# Patient Record
Sex: Female | Born: 1950 | Race: White | Hispanic: No
Health system: Midwestern US, Community
[De-identification: ages and names within clinical notes are randomized; demographics above are authoritative.]

## PROBLEM LIST (undated history)

## (undated) DIAGNOSIS — E039 Hypothyroidism, unspecified: Secondary | ICD-10-CM

## (undated) DIAGNOSIS — Z1211 Encounter for screening for malignant neoplasm of colon: Secondary | ICD-10-CM

## (undated) DIAGNOSIS — Z1231 Encounter for screening mammogram for malignant neoplasm of breast: Secondary | ICD-10-CM

## (undated) DIAGNOSIS — E559 Vitamin D deficiency, unspecified: Principal | ICD-10-CM

## (undated) DIAGNOSIS — Z780 Asymptomatic menopausal state: Secondary | ICD-10-CM

## (undated) HISTORY — DX: Hypothyroidism, unspecified: E03.9

---

## 2005-01-12 ENCOUNTER — Ambulatory Visit: Payer: Self-pay

## 2006-04-27 ENCOUNTER — Ambulatory Visit: Payer: Self-pay

## 2007-08-25 ENCOUNTER — Ambulatory Visit: Payer: Self-pay

## 2008-08-28 ENCOUNTER — Ambulatory Visit: Payer: Self-pay

## 2009-12-23 ENCOUNTER — Ambulatory Visit: Payer: Self-pay

## 2010-03-14 ENCOUNTER — Ambulatory Visit: Payer: Self-pay | Admitting: Unknown Physician Specialty

## 2011-03-19 ENCOUNTER — Ambulatory Visit: Payer: Self-pay

## 2012-06-07 ENCOUNTER — Ambulatory Visit: Payer: Self-pay

## 2012-12-06 ENCOUNTER — Ambulatory Visit: Payer: Self-pay

## 2013-12-25 ENCOUNTER — Ambulatory Visit: Payer: Self-pay

## 2014-12-18 ENCOUNTER — Other Ambulatory Visit: Payer: Self-pay | Admitting: Unknown Physician Specialty

## 2014-12-18 DIAGNOSIS — Z1231 Encounter for screening mammogram for malignant neoplasm of breast: Secondary | ICD-10-CM

## 2014-12-28 ENCOUNTER — Other Ambulatory Visit: Payer: Self-pay | Admitting: Unknown Physician Specialty

## 2014-12-28 ENCOUNTER — Ambulatory Visit
Admission: RE | Admit: 2014-12-28 | Discharge: 2014-12-28 | Disposition: A | Discharge status: Home / Self Care | Payer: Commercial Managed Care - PPO | Source: Ambulatory Visit | Attending: Unknown Physician Specialty | Admitting: Unknown Physician Specialty

## 2014-12-28 DIAGNOSIS — Z1231 Encounter for screening mammogram for malignant neoplasm of breast: Secondary | ICD-10-CM

## 2015-12-30 ENCOUNTER — Other Ambulatory Visit: Payer: Self-pay | Admitting: Obstetrics and Gynecology

## 2015-12-30 DIAGNOSIS — Z1231 Encounter for screening mammogram for malignant neoplasm of breast: Secondary | ICD-10-CM

## 2016-01-06 ENCOUNTER — Other Ambulatory Visit: Payer: Self-pay | Admitting: Obstetrics and Gynecology

## 2016-01-06 ENCOUNTER — Telehealth: Payer: Self-pay | Admitting: Gastroenterology

## 2016-01-06 DIAGNOSIS — Z13820 Encounter for screening for osteoporosis: Secondary | ICD-10-CM

## 2016-01-06 NOTE — Telephone Encounter (Signed)
colonoscopy

## 2016-01-08 ENCOUNTER — Other Ambulatory Visit: Payer: Self-pay | Admitting: Obstetrics and Gynecology

## 2016-01-08 ENCOUNTER — Ambulatory Visit
Admission: RE | Admit: 2016-01-08 | Discharge: 2016-01-08 | Disposition: A | Discharge status: Home / Self Care | Payer: Commercial Managed Care - PPO | Source: Ambulatory Visit | Attending: Obstetrics and Gynecology | Admitting: Obstetrics and Gynecology

## 2016-01-08 DIAGNOSIS — Z1231 Encounter for screening mammogram for malignant neoplasm of breast: Secondary | ICD-10-CM

## 2016-01-13 ENCOUNTER — Other Ambulatory Visit: Payer: Self-pay

## 2016-01-13 ENCOUNTER — Telehealth: Payer: Self-pay

## 2016-01-13 NOTE — Telephone Encounter (Signed)
LVM for patient to return call to schedule Colonoscopy.

## 2016-01-28 ENCOUNTER — Telehealth: Payer: Self-pay

## 2016-01-28 NOTE — Telephone Encounter (Signed)
Called patient and left a voicemail. Appointment not made

## 2016-01-29 NOTE — Telephone Encounter (Signed)
See other notes regarding previous colonoscopy.

## 2016-01-29 NOTE — Telephone Encounter (Signed)
Patient left voice message that she had a colonoscopy 2 years ago and everything was clear. She doesn't need to make an appointment.

## 2016-02-04 ENCOUNTER — Ambulatory Visit: Payer: Commercial Managed Care - PPO

## 2016-02-20 ENCOUNTER — Telehealth: Payer: Self-pay | Admitting: Family Medicine

## 2016-02-20 NOTE — Telephone Encounter (Signed)
Pt called stated she needs a refill on Synthroid. 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 Pt would like refill sent to [facility] in [place]. Pt informed that she would likely need to re-establish as a pt before medication can be sent to the pharmacy. Pt asked that staff send a message to [place]. Thanks.

## 2016-02-20 NOTE — Telephone Encounter (Signed)
Returned patient's call, no answer, LVM  Stating [facility] could not write any type of Rx for her as she has not been seen in about 4 years.  He did suggest that whomever did her bloodwork could write the Rx.  Offered to re establish care in this office and would be happy to write at that time.

## 2016-02-25 ENCOUNTER — Ambulatory Visit: Payer: Commercial Managed Care - PPO

## 2016-03-26 ENCOUNTER — Ambulatory Visit
Admission: RE | Admit: 2016-03-26 | Discharge: 2016-03-26 | Disposition: A | Discharge status: Home / Self Care | Payer: Commercial Managed Care - PPO | Source: Ambulatory Visit | Attending: Obstetrics and Gynecology | Admitting: Obstetrics and Gynecology

## 2016-03-26 ENCOUNTER — Other Ambulatory Visit: Payer: Self-pay | Admitting: Obstetrics and Gynecology

## 2016-03-26 DIAGNOSIS — Z780 Asymptomatic menopausal state: Secondary | ICD-10-CM | POA: Diagnosis not present

## 2016-03-26 DIAGNOSIS — E039 Hypothyroidism, unspecified: Secondary | ICD-10-CM | POA: Diagnosis not present

## 2016-03-26 DIAGNOSIS — Z13820 Encounter for screening for osteoporosis: Secondary | ICD-10-CM

## 2016-03-26 DIAGNOSIS — M85851 Other specified disorders of bone density and structure, right thigh: Secondary | ICD-10-CM | POA: Insufficient documentation

## 2017-03-17 ENCOUNTER — Telehealth: Payer: Self-pay

## 2017-03-17 ENCOUNTER — Other Ambulatory Visit: Payer: Self-pay | Admitting: Obstetrics and Gynecology

## 2017-03-17 DIAGNOSIS — Z13220 Encounter for screening for lipoid disorders: Secondary | ICD-10-CM

## 2017-03-17 DIAGNOSIS — Z0000 Encounter for general adult medical examination without abnormal findings: Secondary | ICD-10-CM

## 2017-03-17 DIAGNOSIS — Z131 Encounter for screening for diabetes mellitus: Secondary | ICD-10-CM

## 2017-03-17 DIAGNOSIS — Z1329 Encounter for screening for other suspected endocrine disorder: Secondary | ICD-10-CM

## 2017-03-17 NOTE — Telephone Encounter (Signed)
Pt lives in [place], [place] and is coming in Fri am for appt c SDJ.  Would like to have order for 'full blood panel' put in so she can drive in fasting, have blood drawn then eat on Fri morning.  Also would like order for mammogram to be put in at [facility] for [place] am.  [phone]

## 2017-03-18 NOTE — Addendum Note (Signed)
Addended by: Prentice Docker D on: 03/18/2017 10:13 AM   Modules accepted: Orders

## 2017-03-18 NOTE — Telephone Encounter (Signed)
Pt calling.  Hasn't heard from yesterday's phone call.  Adv orders are in and needs appt for blood work.  Adv can call [facility] after visit to sched mammogram.  Tx'd for front desk to sched lab.

## 2017-03-19 ENCOUNTER — Other Ambulatory Visit: Payer: Medicare Other

## 2017-03-19 ENCOUNTER — Ambulatory Visit (INDEPENDENT_AMBULATORY_CARE_PROVIDER_SITE_OTHER): Payer: Medicare Other | Admitting: Obstetrics and Gynecology

## 2017-03-19 ENCOUNTER — Encounter: Payer: Self-pay | Admitting: Obstetrics and Gynecology

## 2017-03-19 VITALS — BP 128/78 | Ht 67.0 in | Wt 194.0 lb

## 2017-03-19 DIAGNOSIS — Z1329 Encounter for screening for other suspected endocrine disorder: Secondary | ICD-10-CM | POA: Diagnosis not present

## 2017-03-19 DIAGNOSIS — Z0000 Encounter for general adult medical examination without abnormal findings: Secondary | ICD-10-CM | POA: Diagnosis not present

## 2017-03-19 DIAGNOSIS — Z1331 Encounter for screening for depression: Secondary | ICD-10-CM

## 2017-03-19 DIAGNOSIS — M85859 Other specified disorders of bone density and structure, unspecified thigh: Secondary | ICD-10-CM

## 2017-03-19 DIAGNOSIS — Z1211 Encounter for screening for malignant neoplasm of colon: Secondary | ICD-10-CM

## 2017-03-19 DIAGNOSIS — Z13220 Encounter for screening for lipoid disorders: Secondary | ICD-10-CM | POA: Diagnosis not present

## 2017-03-19 DIAGNOSIS — Z1339 Encounter for screening examination for other mental health and behavioral disorders: Secondary | ICD-10-CM

## 2017-03-19 DIAGNOSIS — Z01419 Encounter for gynecological examination (general) (routine) without abnormal findings: Secondary | ICD-10-CM | POA: Diagnosis not present

## 2017-03-19 DIAGNOSIS — Z131 Encounter for screening for diabetes mellitus: Secondary | ICD-10-CM | POA: Diagnosis not present

## 2017-03-19 NOTE — Progress Notes (Signed)
Gynecology Annual Exam  PCP: Patient, No Pcp Per  Chief Complaint  Patient presents with  . Annual Exam    History of Present Illness:  [name] is a 66 y.o. [phone] who LMP was No LMP recorded. Patient is postmenopausal., presents today for her annual examination.  Denies postmenopausal bleeding.  She does not have vasomotor sx.  She is not sexually active. She does not have vaginal dryness.  Last Pap: 2016  Results were: no abnormalities /neg HPV DNA.  Hx of STDs: none  Last mammogram: 12/2015  Results were: normal--routine follow-up in 12 months, BiRads 1 There is no FH of breast cancer. There is no FH of ovarian cancer. The patient does not do self-breast exams.  Colonoscopy: due to history of polyps.   DEXA: 1 year ago, osteopenia with FRAX score 0.8% for hip fracture (treatment threshold is 3%), 8.4% for major osteoporotic fracture (20% threshold for treatment).  Tobacco use: The patient denies current or previous tobacco use. Alcohol use: none Exercise: not active  She does get adequate calcium and Vitamin D in her diet.  The patient wears seatbelts: yes.     Past Medical History:  Diagnosis Date  . Hypothyroidism     History reviewed. No pertinent surgical history.  Prior to Admission medications   Medication Sig Start Date End Date Taking? Authorizing Provider  levothyroxine (SYNTHROID, LEVOTHROID) 175 MCG tablet Take by mouth.    [provider]  SYNTHROID 50 MCG tablet TAKE 1 TABLET EVERY DAY ON AN EMPTY STOMACH WITH A GLASS OF WATER AT LEAST 30-60 MINUTES BEFORE BREAKFAST 03/17/17   [name], MD    Allergies  Allergen Reactions  . Sulfa Antibiotics Rash    Gynecologic History:  No LMP recorded. Patient is postmenopausal. Contraception: none  Obstetric History: [id number]  Family History  Problem Relation Age of Onset  . Breast cancer Neg Hx     Social History   Social History  . Marital status: Divorced    Spouse  name: N/A  . Number of children: N/A  . Years of education: N/A   Occupational History  . Not on file.   Social History Main Topics  . Smoking status: Never Smoker  . Smokeless tobacco: Never Used  . Alcohol use No  . Drug use: No  . Sexual activity: Not Currently    Birth control/ protection: Post-menopausal   Other Topics Concern  . Not on file   Social History Narrative  . No narrative on file    Review of Systems  Constitutional: Negative.   HENT: Negative.   Eyes: Negative.   Respiratory: Negative.   Cardiovascular: Negative.   Gastrointestinal: Negative.   Genitourinary: Negative.   Musculoskeletal: Negative.   Skin: Negative.   Neurological: Negative.   Psychiatric/Behavioral: Negative.      Physical Exam BP 128/78   Ht 5\' 7"  (1.702 m)   Wt 194 lb (88 kg)   BMI 30.38 kg/m   Physical Exam  Constitutional: She is oriented to person, place, and time. She appears well-developed and well-nourished. No distress.  Genitourinary: Vagina normal and uterus normal. Pelvic exam was performed with patient supine. There is no rash, tenderness or lesion on the right labia. There is no rash, tenderness or lesion on the left labia. Right adnexum does not display mass, does not display tenderness and does not display fullness. Left adnexum does not display mass, does not display tenderness and does not display fullness. Cervix does not  exhibit motion tenderness, lesion or polyp.   Uterus is mobile. Uterus is not enlarged, tender or exhibiting a mass.  HENT:  Head: Normocephalic and atraumatic.  Eyes: EOM are normal. No scleral icterus.  Neck: Normal range of motion. Neck supple. No thyromegaly present.  Cardiovascular: Normal rate and regular rhythm.  Exam reveals no gallop.   No murmur heard. Pulmonary/Chest: Effort normal and breath sounds normal. No respiratory distress. She has no wheezes. She has no rales. Right breast exhibits no inverted nipple, no mass, no nipple  discharge, no skin change and no tenderness. Left breast exhibits no inverted nipple, no mass, no nipple discharge, no skin change and no tenderness.  Abdominal: Soft. Bowel sounds are normal. She exhibits no distension and no mass. There is no tenderness. There is no rebound and no guarding.  Musculoskeletal: Normal range of motion. She exhibits no edema.  Lymphadenopathy:    She has no cervical adenopathy.  Neurological: She is alert and oriented to person, place, and time. No cranial nerve deficit.  Skin: Skin is warm and dry. No erythema.  Psychiatric: She has a normal mood and affect. Her behavior is normal. Judgment normal.   Female chaperone present for pelvic and breast  portions of the physical exam  Results: AUDIT Questionnaire (screen for alcoholism): 1 PHQ-9: 2  Assessment: 66 y.o. [id number] female here for routine gynecologic examination.  Plan: Problem List Items Addressed This Visit    Osteopenia of hip    Other Visit Diagnoses    Women's annual routine gynecological examination    -  Primary   Screening for depression       Screening for alcohol problem       Screen for colon cancer       Relevant Orders   Ambulatory referral to Gastroenterology      Screening: -- Blood pressure screen normal -- Colonoscopy - due - will schedule -- Mammogram - due. Patient to call [facility] to arrange. She understands that it is her responsibility to arrange this. -- Weight screening: obese: discussed management options, including lifestyle, dietary, and exercise. -- Depression screening negative (PHQ-9) -- Nutrition: normal -- cholesterol screening: will obtain -- osteoporosis screening: due next year. Continue taking Calcium/Vitamin D -- tobacco screening: not using -- alcohol screening: AUDIT questionnaire indicates low-risk usage. -- family history of breast cancer screening: done. not at high risk. -- no evidence of domestic violence or intimate partner violence. -- STD  screening: gonorrhea/chlamydia NAAT not collected per patient request. -- pap smear not collected per ASCCP guidelines -- flu vaccine patient to get this year -- HPV vaccination series: not eligilbe  -- vaccines: up to date. -- screening labs obtained today.  Entered in separate 'Orders Only' encounter.    Prentice Docker, MD 03/23/2017 1:47 PM

## 2017-03-20 LAB — LIPID PANEL WITH LDL/HDL RATIO
CHOLESTEROL TOTAL: 172 mg/dL (ref 100–199)
HDL: 46 mg/dL (ref >39)
LDL Calculated: 95 mg/dL (ref 0–99)
LDL/HDL RATIO: 2.1 ratio (ref 0.0–3.2)
TRIGLYCERIDES: 155 mg/dL — AB (ref 0–149)
VLDL Cholesterol Cal: 31 mg/dL (ref 5–40)

## 2017-03-20 LAB — COMPREHENSIVE METABOLIC PANEL
ALK PHOS: 67 IU/L (ref 39–117)
ALT: 20 IU/L (ref 0–32)
AST: 20 IU/L (ref 0–40)
Albumin/Globulin Ratio: 1.6 (ref 1.2–2.2)
Albumin: 4.5 g/dL (ref 3.6–4.8)
BILIRUBIN TOTAL: 0.8 mg/dL (ref 0.0–1.2)
BUN/Creatinine Ratio: 15 (ref 12–28)
BUN: 11 mg/dL (ref 8–27)
CHLORIDE: 104 mmol/L (ref 96–106)
CO2: 22 mmol/L (ref 20–29)
Calcium: 9.6 mg/dL (ref 8.7–10.3)
Creatinine, Ser: 0.73 mg/dL (ref 0.57–1.00)
GFR calc Af Amer: 99 mL/min/{1.73_m2} (ref >59)
GFR calc non Af Amer: 85 mL/min/{1.73_m2} (ref >59)
GLUCOSE: 96 mg/dL (ref 65–99)
Globulin, Total: 2.8 g/dL (ref 1.5–4.5)
Potassium: 4.5 mmol/L (ref 3.5–5.2)
Sodium: 140 mmol/L (ref 134–144)
Total Protein: 7.3 g/dL (ref 6.0–8.5)

## 2017-03-20 LAB — TSH+FREE T4
FREE T4: 1.24 ng/dL (ref 0.82–1.77)
TSH: 3.17 u[IU]/mL (ref 0.450–4.500)

## 2017-03-20 LAB — CBC
HEMATOCRIT: 45.9 % (ref 34.0–46.6)
HEMOGLOBIN: 14.8 g/dL (ref 11.1–15.9)
MCH: 29.7 pg (ref 26.6–33.0)
MCHC: 32.2 g/dL (ref 31.5–35.7)
MCV: 92 fL (ref 79–97)
Platelets: 324 10*3/uL (ref 150–379)
RBC: 4.99 x10E6/uL (ref 3.77–5.28)
RDW: 14 % (ref 12.3–15.4)
WBC: 4.9 10*3/uL (ref 3.4–10.8)

## 2017-03-20 LAB — HGB A1C W/O EAG: Hgb A1c MFr Bld: 5.7 % — ABNORMAL HIGH (ref 4.8–5.6)

## 2017-03-20 LAB — VITAMIN D 25 HYDROXY (VIT D DEFICIENCY, FRACTURES): Vit D, 25-Hydroxy: 31.3 ng/mL (ref 30.0–100.0)

## 2017-03-23 ENCOUNTER — Encounter: Payer: Self-pay | Admitting: Obstetrics and Gynecology

## 2017-03-23 DIAGNOSIS — M85859 Other specified disorders of bone density and structure, unspecified thigh: Secondary | ICD-10-CM | POA: Insufficient documentation

## 2017-03-29 ENCOUNTER — Telehealth: Payer: Self-pay

## 2017-03-29 NOTE — Telephone Encounter (Signed)
It does sound like she would need to be seen where she is.  I can't call in medication for a UTI in this circumstance.

## 2017-03-29 NOTE — Telephone Encounter (Signed)
Trying to call pt back, no answer. Please advise her of this when she calls back. Thank you

## 2017-03-29 NOTE — Telephone Encounter (Signed)
Pt calling triage this AM, states she is having UTI symptoms starting Thursday. Urine is cloudy, odor , urinary frequency, dysuria. Pt is  In [place] visiting daughter, pt needs RX sent to [facility]  [place] [address] , phone number [phone]. pt aware I would ask SDJ about this since we cannot test her urine. Please ask SDJ and let me know I will call pt back.

## 2017-03-29 NOTE — Telephone Encounter (Signed)
I would think she would need to be seen at an urgent care there. Message forwarded to [facility] as instructed

## 2017-03-30 ENCOUNTER — Other Ambulatory Visit: Payer: Self-pay | Admitting: Obstetrics and Gynecology

## 2017-03-30 DIAGNOSIS — Z1231 Encounter for screening mammogram for malignant neoplasm of breast: Secondary | ICD-10-CM

## 2017-04-01 NOTE — Telephone Encounter (Signed)
Formatting of this note might be different from the original.  Called by Dola ArgyleAnna Hembrick, NP    Call Made:Yes    Patient reached:No    Additional call back comments:   Electronically signed by Dola ArgyleAnna Hembrick, NP at 04/01/2017  3:00 PM EDT

## 2017-04-07 ENCOUNTER — Encounter: Payer: Self-pay | Admitting: Obstetrics and Gynecology

## 2017-04-12 ENCOUNTER — Other Ambulatory Visit: Payer: Self-pay | Admitting: Obstetrics and Gynecology

## 2017-04-12 DIAGNOSIS — E039 Hypothyroidism, unspecified: Secondary | ICD-10-CM

## 2017-04-14 NOTE — Telephone Encounter (Signed)
Can you verify the dosing for this medication? I have two different doses. Thanks

## 2017-04-15 DIAGNOSIS — D2261 Melanocytic nevi of right upper limb, including shoulder: Secondary | ICD-10-CM | POA: Diagnosis not present

## 2017-04-15 DIAGNOSIS — L400 Psoriasis vulgaris: Secondary | ICD-10-CM | POA: Diagnosis not present

## 2017-04-15 DIAGNOSIS — D2272 Melanocytic nevi of left lower limb, including hip: Secondary | ICD-10-CM | POA: Diagnosis not present

## 2017-04-15 DIAGNOSIS — M1280 Other specific arthropathies, not elsewhere classified, unspecified site: Secondary | ICD-10-CM | POA: Diagnosis not present

## 2017-04-15 NOTE — Telephone Encounter (Signed)
-----   Message from [name], MD sent at 04/13/2017 10:34 AM EDT ----- Regarding: verify dosing Hi Tanya Quinn, I have two synthroid medications listed in the system for this patient.  One dose os 50 mcg and another dose os 175 mcg.  Would you mind calling her to verify which one she takes?  I have a refill request in for her.  Thank you!

## 2017-04-15 NOTE — Telephone Encounter (Signed)
Please let me know when pt calls back. Lm on vm for her.

## 2017-04-16 ENCOUNTER — Ambulatory Visit
Admission: RE | Admit: 2017-04-16 | Discharge: 2017-04-16 | Disposition: A | Discharge status: Home / Self Care | Payer: Medicare Other | Source: Ambulatory Visit | Attending: Obstetrics and Gynecology | Admitting: Obstetrics and Gynecology

## 2017-04-16 DIAGNOSIS — Z1231 Encounter for screening mammogram for malignant neoplasm of breast: Secondary | ICD-10-CM | POA: Insufficient documentation

## 2017-04-20 ENCOUNTER — Telehealth: Payer: Self-pay | Admitting: Gastroenterology

## 2017-04-20 NOTE — Telephone Encounter (Signed)
The doctors office called and would like this patient called for colonoscopy

## 2017-04-21 ENCOUNTER — Encounter: Payer: Self-pay | Admitting: Obstetrics and Gynecology

## 2017-04-21 NOTE — Telephone Encounter (Signed)
LVM for pt to return my call.

## 2017-04-28 DIAGNOSIS — Z23 Encounter for immunization: Secondary | ICD-10-CM | POA: Diagnosis not present

## 2017-07-04 ENCOUNTER — Telehealth: Payer: Medicare Other | Admitting: Nurse Practitioner

## 2017-07-04 DIAGNOSIS — J40 Bronchitis, not specified as acute or chronic: Secondary | ICD-10-CM

## 2017-07-04 NOTE — Progress Notes (Signed)

## 2018-03-08 DIAGNOSIS — L400 Psoriasis vulgaris: Secondary | ICD-10-CM | POA: Diagnosis not present

## 2018-03-09 ENCOUNTER — Telehealth: Payer: Self-pay | Admitting: Obstetrics and Gynecology

## 2018-03-09 NOTE — Telephone Encounter (Signed)
Patient is requesting labs for Synthroid and Vit. D blood work. Please advise  order for labs so I may schedule patient.

## 2018-03-10 NOTE — Telephone Encounter (Signed)
Just to be clear, she is only coming in for lab work, correct? I see that she had an annual last year and with Medicare, she is only allowed an annual every other year, right?

## 2018-03-10 NOTE — Telephone Encounter (Signed)
Yes Sir that is correct on needing labs. No physical this year. Patient is aware.

## 2018-03-11 ENCOUNTER — Other Ambulatory Visit: Payer: Self-pay | Admitting: Obstetrics and Gynecology

## 2018-03-11 DIAGNOSIS — E039 Hypothyroidism, unspecified: Secondary | ICD-10-CM

## 2018-03-11 DIAGNOSIS — E559 Vitamin D deficiency, unspecified: Secondary | ICD-10-CM

## 2018-03-11 NOTE — Telephone Encounter (Signed)
I have placed orders for her thyroid and her vitamin D level. She can make an appointment to have those checked any time.

## 2018-03-11 NOTE — Telephone Encounter (Signed)
Patient is schedule 03/23/18

## 2018-03-14 ENCOUNTER — Telehealth: Payer: Self-pay | Admitting: Obstetrics and Gynecology

## 2018-03-14 NOTE — Telephone Encounter (Signed)
Patient is calling regarding her Vit D and thyroid lab work from her annual last year.  What should have been the DX codes for those?

## 2018-03-22 NOTE — Telephone Encounter (Signed)
What are the recommended diagnostic codes for testing vitamin D and thyroid labs for medicare? Apparently, I did not put in the correct codes last year.

## 2018-03-23 ENCOUNTER — Ambulatory Visit: Payer: Medicare Other | Admitting: Obstetrics and Gynecology

## 2018-03-28 ENCOUNTER — Other Ambulatory Visit: Payer: Medicare Other

## 2018-03-30 ENCOUNTER — Other Ambulatory Visit: Payer: Medicare Other

## 2018-03-30 DIAGNOSIS — E039 Hypothyroidism, unspecified: Secondary | ICD-10-CM | POA: Diagnosis not present

## 2018-03-30 DIAGNOSIS — E559 Vitamin D deficiency, unspecified: Secondary | ICD-10-CM

## 2018-03-31 LAB — TSH+FREE T4
Free T4: 1.25 ng/dL (ref 0.82–1.77)
TSH: 4.15 u[IU]/mL (ref 0.450–4.500)

## 2018-03-31 LAB — VITAMIN D 25 HYDROXY (VIT D DEFICIENCY, FRACTURES): VIT D 25 HYDROXY: 28.1 ng/mL — AB (ref 30.0–100.0)

## 2018-04-01 ENCOUNTER — Other Ambulatory Visit: Payer: Self-pay | Admitting: Obstetrics and Gynecology

## 2018-04-01 ENCOUNTER — Telehealth: Payer: Self-pay

## 2018-04-01 DIAGNOSIS — E039 Hypothyroidism, unspecified: Secondary | ICD-10-CM | POA: Insufficient documentation

## 2018-04-01 MED ORDER — SYNTHROID 50 MCG PO TABS: 50.0000 ug | ORAL_TABLET | Freq: Every day | ORAL | 4 refills | Status: DC

## 2018-04-01 NOTE — Telephone Encounter (Signed)
Pt had blood drawn on Wednesday.  Would like for results to be posted on MyChart.  Also needs refill on Synthroid to reflect results sent [facility] since [facility] is closed.  [phone]

## 2018-04-11 ENCOUNTER — Other Ambulatory Visit: Payer: Self-pay | Admitting: Obstetrics and Gynecology

## 2018-04-11 DIAGNOSIS — Z1231 Encounter for screening mammogram for malignant neoplasm of breast: Secondary | ICD-10-CM

## 2018-04-14 ENCOUNTER — Ambulatory Visit
Admission: RE | Admit: 2018-04-14 | Discharge: 2018-04-14 | Disposition: A | Discharge status: Home / Self Care | Payer: Medicare Other | Source: Ambulatory Visit | Attending: Obstetrics and Gynecology | Admitting: Obstetrics and Gynecology

## 2018-04-14 DIAGNOSIS — Z1231 Encounter for screening mammogram for malignant neoplasm of breast: Secondary | ICD-10-CM | POA: Diagnosis not present

## 2018-05-03 DIAGNOSIS — Z23 Encounter for immunization: Secondary | ICD-10-CM | POA: Diagnosis not present

## 2018-08-03 DIAGNOSIS — H02889 Meibomian gland dysfunction of unspecified eye, unspecified eyelid: Secondary | ICD-10-CM | POA: Diagnosis not present

## 2018-11-04 IMAGING — MG MM DIGITAL SCREENING BILAT W/ TOMO W/ CAD
8 series · 8 of 24 positions shown · non-contrast
Comparison: Previous exam(s).

CLINICAL DATA: Screening.

EXAM:
DIGITAL SCREENING BILATERAL MAMMOGRAM WITH TOMO AND CAD

[R CC synth-2D]
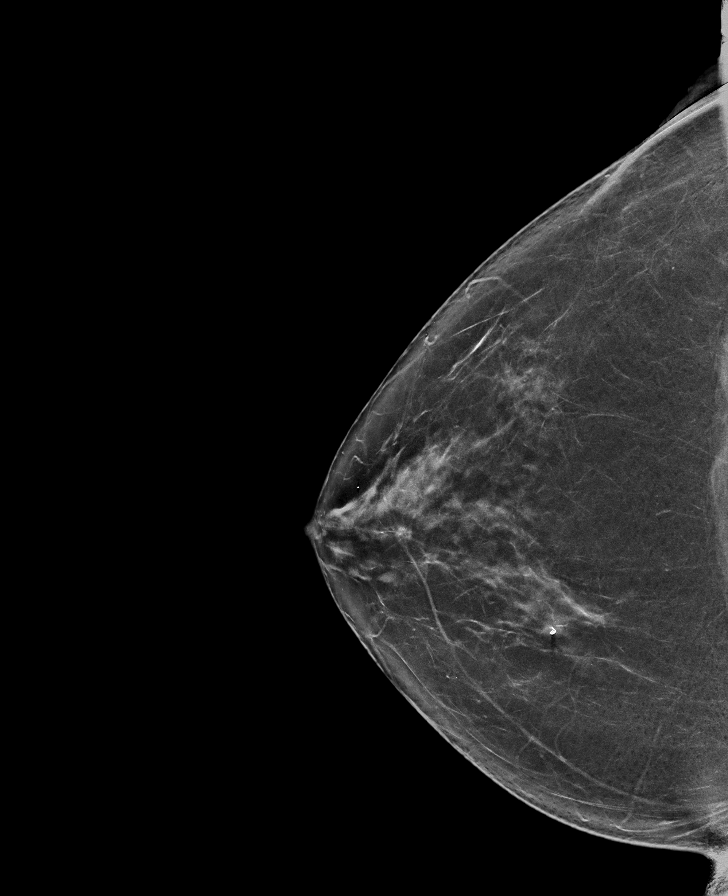

[R MLO synth-2D]
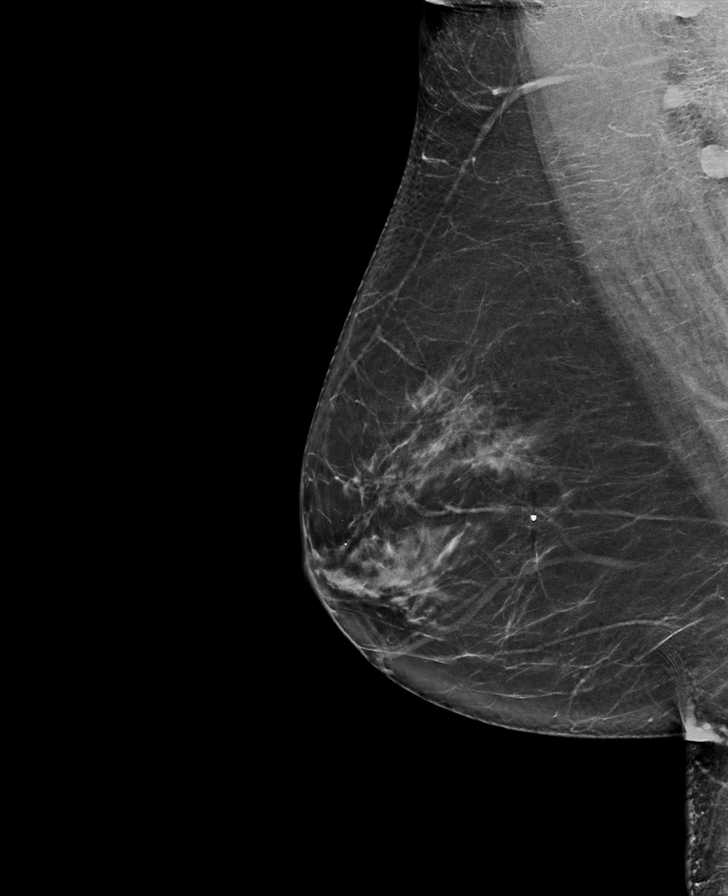

[L CC synth-2D]
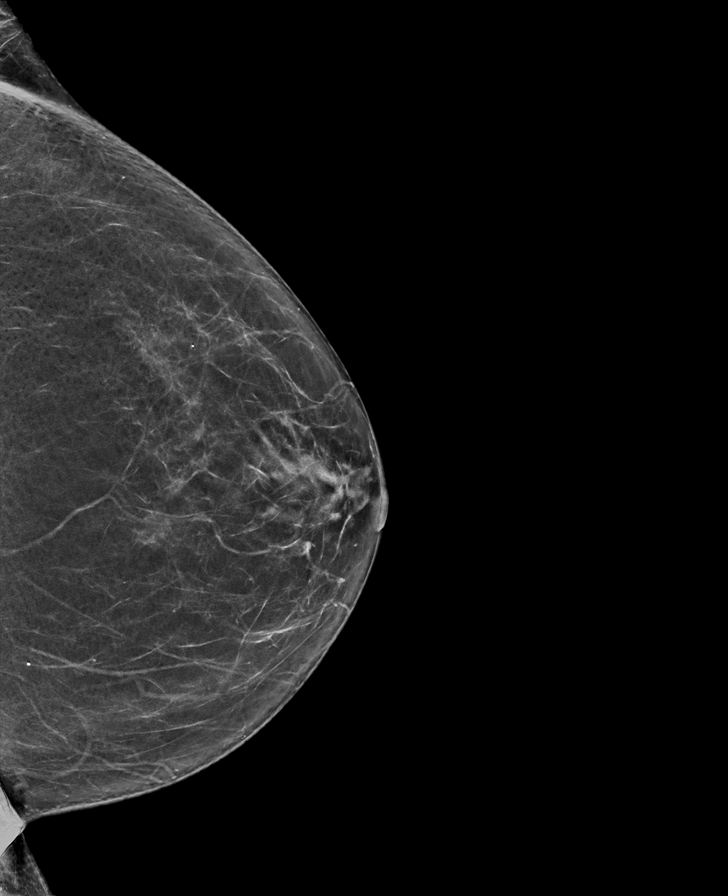

[L MLO synth-2D]
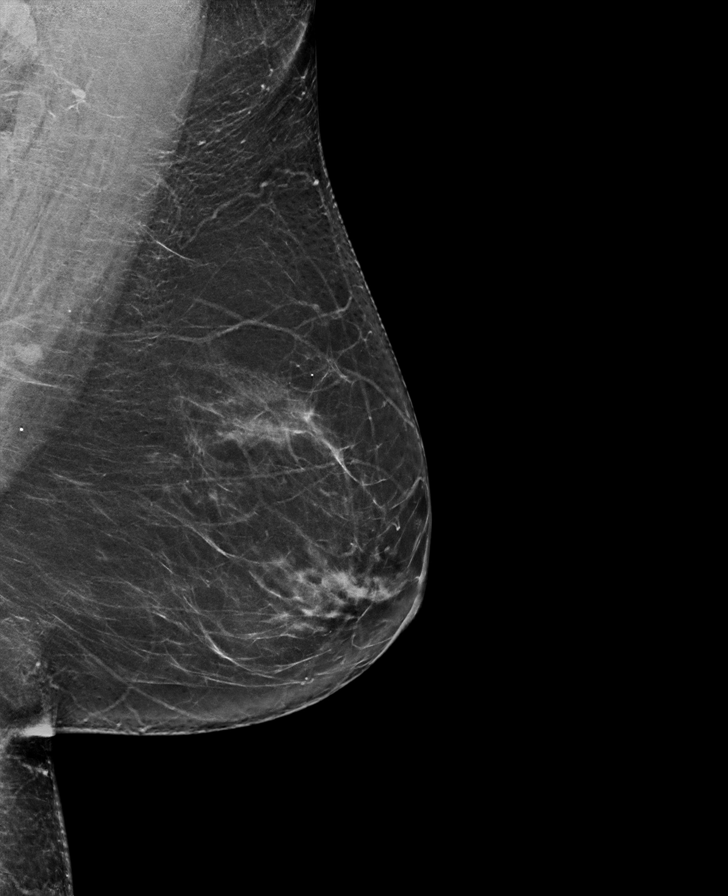

[R CC tomo · tomo slice 37/72.0]
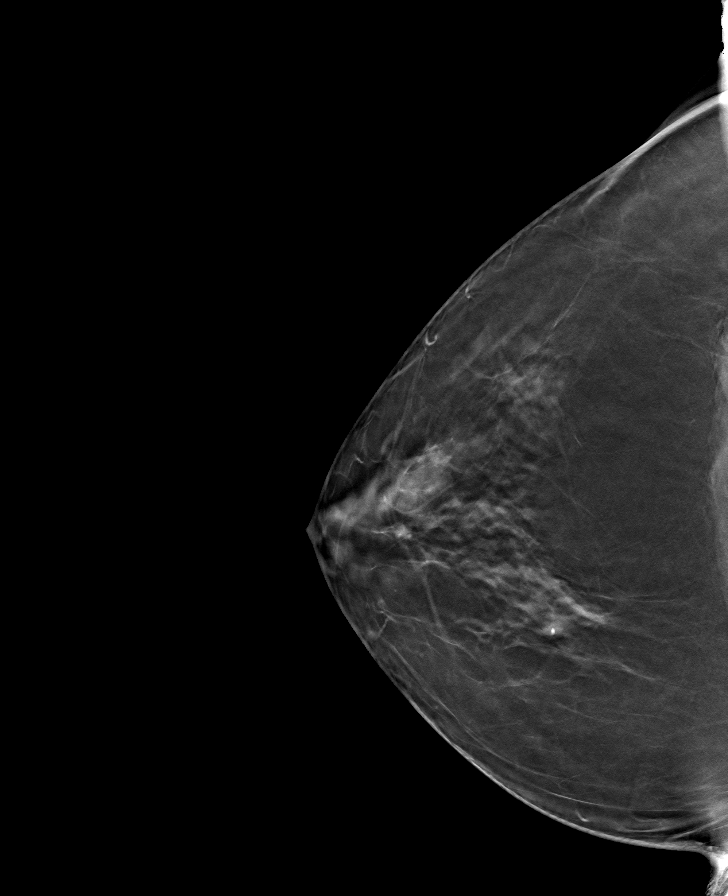

[R MLO tomo · tomo slice 40/79.0]
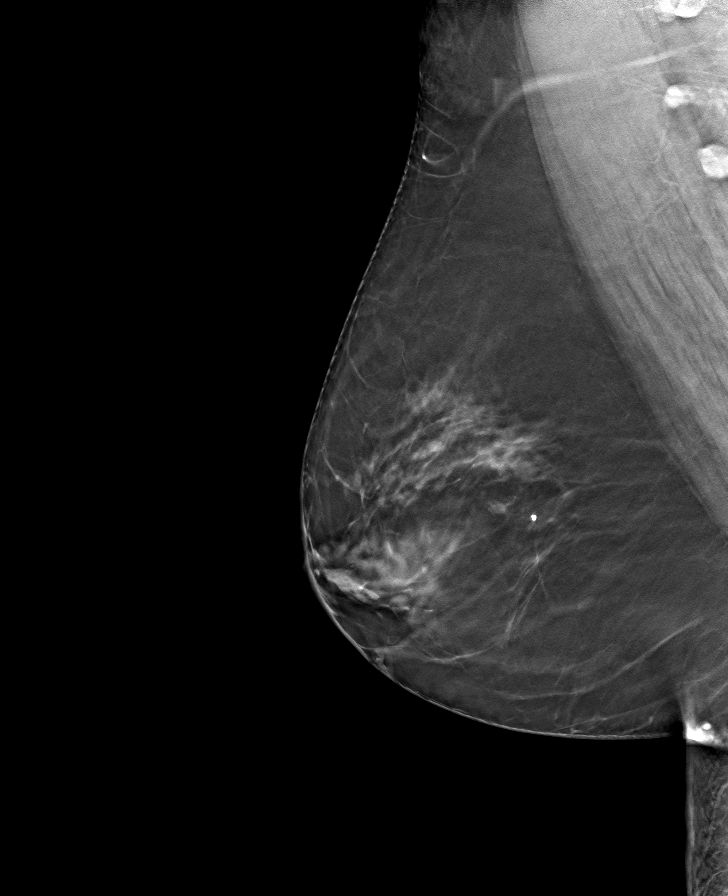

[L MLO tomo · tomo slice 40/79.0]
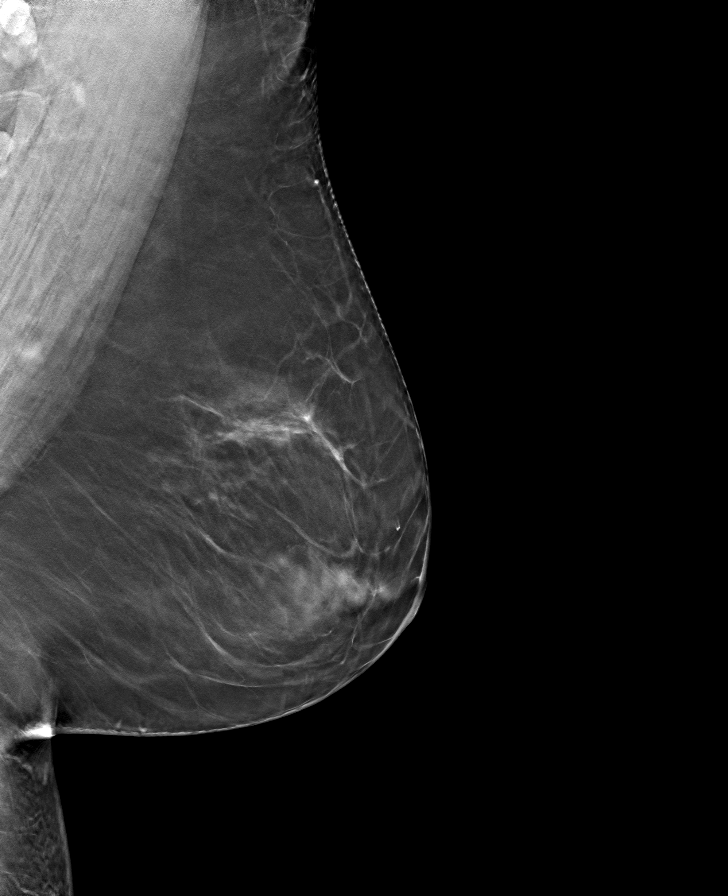

[L CC tomo · tomo slice 35/68.0]
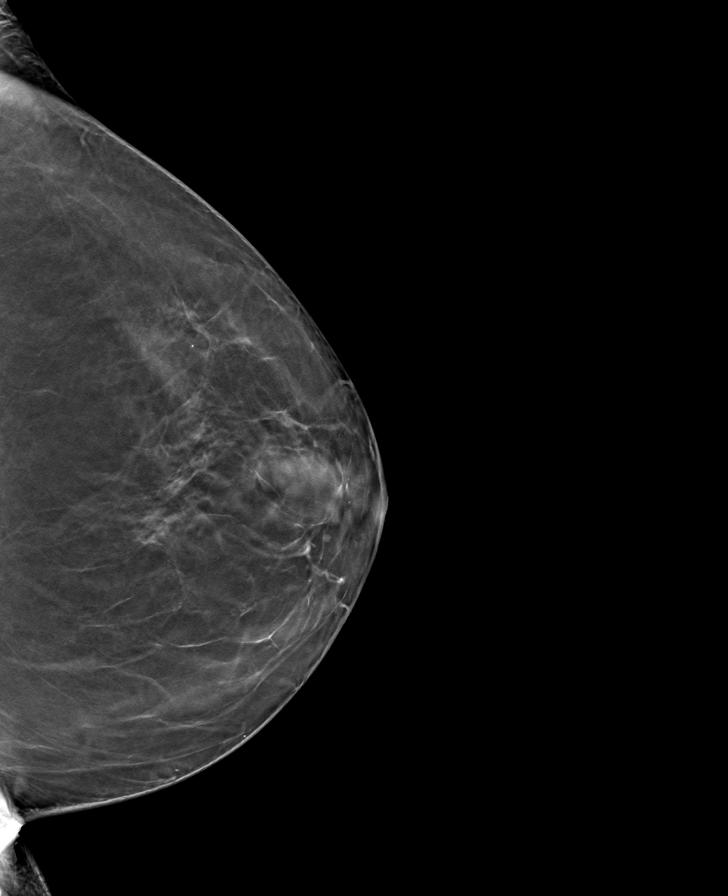

[8 of 24 positions shown; findings below may reference images not displayed]

ACR Breast Density Category b: There are scattered areas of
fibroglandular density.
FINDINGS: There are no findings suspicious for malignancy. Images were
processed with CAD.
IMPRESSION: No mammographic evidence of malignancy. A result letter of this
screening mammogram will be mailed directly to the patient.

RECOMMENDATION:
Screening mammogram in one year. (Code:[id number])

BI-RADS CATEGORY  1: Negative.

## 2018-12-02 DIAGNOSIS — E039 Hypothyroidism, unspecified: Secondary | ICD-10-CM | POA: Diagnosis not present

## 2018-12-02 DIAGNOSIS — Z791 Long term (current) use of non-steroidal anti-inflammatories (NSAID): Secondary | ICD-10-CM | POA: Diagnosis not present

## 2018-12-02 DIAGNOSIS — L4050 Arthropathic psoriasis, unspecified: Secondary | ICD-10-CM | POA: Diagnosis not present

## 2018-12-02 DIAGNOSIS — Z79899 Other long term (current) drug therapy: Secondary | ICD-10-CM | POA: Diagnosis not present

## 2018-12-02 DIAGNOSIS — Z809 Family history of malignant neoplasm, unspecified: Secondary | ICD-10-CM | POA: Diagnosis not present

## 2018-12-02 DIAGNOSIS — Z882 Allergy status to sulfonamides status: Secondary | ICD-10-CM | POA: Diagnosis not present

## 2018-12-02 DIAGNOSIS — G8929 Other chronic pain: Secondary | ICD-10-CM | POA: Diagnosis not present

## 2018-12-29 DIAGNOSIS — L400 Psoriasis vulgaris: Secondary | ICD-10-CM | POA: Diagnosis not present

## 2019-02-14 ENCOUNTER — Telehealth: Payer: Self-pay

## 2019-02-14 ENCOUNTER — Other Ambulatory Visit: Payer: Self-pay

## 2019-02-14 ENCOUNTER — Encounter: Payer: Self-pay | Admitting: Obstetrics and Gynecology

## 2019-02-14 ENCOUNTER — Ambulatory Visit (INDEPENDENT_AMBULATORY_CARE_PROVIDER_SITE_OTHER): Payer: Medicare HMO | Admitting: Obstetrics and Gynecology

## 2019-02-14 VITALS — BP 150/90 | Ht 67.0 in | Wt 208.2 lb

## 2019-02-14 DIAGNOSIS — N3001 Acute cystitis with hematuria: Secondary | ICD-10-CM | POA: Diagnosis not present

## 2019-02-14 LAB — POCT URINALYSIS DIPSTICK
Bilirubin, UA: NEGATIVE
Glucose, UA: NEGATIVE
Ketones, UA: NEGATIVE
Nitrite, UA: NEGATIVE
Protein, UA: NEGATIVE
Spec Grav, UA: 1.01 (ref 1.010–1.025)
pH, UA: 5 (ref 5.0–8.0)

## 2019-02-14 MED ORDER — NITROFURANTOIN MONOHYD MACRO 100 MG PO CAPS: 100.0000 mg | ORAL_CAPSULE | Freq: Two times a day (BID) | ORAL | 0 refills | Status: AC

## 2019-02-14 NOTE — Patient Instructions (Signed)
I value your feedback and entrusting us with your care. If you get a [facility] patient survey, I would appreciate you taking the time to let us know about your experience today. Thank you! 

## 2019-02-14 NOTE — Progress Notes (Signed)
[name], MD   Chief Complaint  Patient presents with  . Urinary Tract Infection    cloudy urine, urinary frequency, no blood in urine or burning sensation x 3 days    HPI:      [name] is a 68 y.o. [id number] who LMP was No LMP recorded. Patient is postmenopausal., presents today for UTI sx of urinary frequency with good flow, cloudiness for 3 days. No dysuria, hematuria, LBP, pelvic pain, fevers. Hx of UTIs in distant past. No vag sx, no vag bleeding. Not sex active. Taking AZO with some relief.    Past Medical History:  Diagnosis Date  . Hypothyroidism     History reviewed. No pertinent surgical history.  Family History  Problem Relation Age of Onset  . Breast cancer Neg Hx     Social History   Socioeconomic History  . Marital status: Divorced    Spouse name: Not on file  . Number of children: Not on file  . Years of education: Not on file  . Highest education level: Not on file  Occupational History  . Not on file  Social Needs  . Financial resource strain: Not on file  . Food insecurity    Worry: Not on file    Inability: Not on file  . Transportation needs    Medical: Not on file    Non-medical: Not on file  Tobacco Use  . Smoking status: Never Smoker  . Smokeless tobacco: Never Used  Substance and Sexual Activity  . Alcohol use: No  . Drug use: No  . Sexual activity: Not Currently    Birth control/protection: Post-menopausal  Lifestyle  . Physical activity    Days per week: Not on file    Minutes per session: Not on file  . Stress: Not on file  Relationships  . Social Herbalist on phone: Not on file    Gets together: Not on file    Attends religious service: Not on file    Active member of club or organization: Not on file    Attends meetings of clubs or organizations: Not on file    Relationship status: Not on file  . Intimate partner violence    Fear of current or ex partner: Not on file    Emotionally  abused: Not on file    Physically abused: Not on file    Forced sexual activity: Not on file  Other Topics Concern  . Not on file  Social History Narrative  . Not on file    Outpatient Medications Prior to Visit  Medication Sig Dispense Refill  . Apremilast 30 MG TABS Take by mouth.    . meloxicam (MOBIC) 15 MG tablet Take by mouth.    . SYNTHROID 50 MCG tablet Take 1 tablet (50 mcg total) by mouth daily before breakfast. 90 tablet 4  . Apremilast 30 MG TABS Take by mouth.    . meloxicam (MOBIC) 7.5 MG tablet Take by mouth.     No facility-administered medications prior to visit.       ROS:  Review of Systems  Constitutional: Negative for fever.  Gastrointestinal: Negative for blood in stool, constipation, diarrhea, nausea and vomiting.  Genitourinary: Positive for frequency. Negative for dyspareunia, dysuria, flank pain, hematuria, urgency, vaginal bleeding, vaginal discharge and vaginal pain.  Musculoskeletal: Negative for back pain.  Skin: Negative for rash.    OBJECTIVE:   Vitals:  BP (!) 150/90  Ht 5\' 7"  (1.702 m)   Wt 208 lb 3.2 oz (94.4 kg)   BMI 32.61 kg/m   Physical Exam Vitals signs reviewed.  Constitutional:      Appearance: She is well-developed. She is not ill-appearing or toxic-appearing.  Neck:     Musculoskeletal: Normal range of motion.  Pulmonary:     Effort: Pulmonary effort is normal.  Abdominal:     Tenderness: There is no right CVA tenderness or left CVA tenderness.  Musculoskeletal: Normal range of motion.  Neurological:     General: No focal deficit present.     Mental Status: She is alert and oriented to person, place, and time.     Cranial Nerves: No cranial nerve deficit.  Psychiatric:        Behavior: Behavior normal.        Thought Content: Thought content normal.        Judgment: Judgment normal.     Results: Results for orders placed or performed in visit on 02/14/19 (from the past 24 hour(s))  POCT Urinalysis Dipstick      Status: Abnormal   Collection Time: 02/14/19  4:51 PM  Result Value Ref Range   Color, UA yellow    Clarity, UA hazy    Glucose, UA Negative Negative   Bilirubin, UA neg    Ketones, UA neg    Spec Grav, UA 1.010 1.010 - 1.025   Blood, UA mod    pH, UA 5.0 5.0 - 8.0   Protein, UA Negative Negative   Urobilinogen, UA     Nitrite, UA neg    Leukocytes, UA Trace (A) Negative   Appearance     Odor       Assessment/Plan: Acute cystitis with hematuria - Plan: Urine Culture, POCT Urinalysis Dipstick, nitrofurantoin, macrocrystal-monohydrate, (MACROBID) 100 MG capsule, Pos sx/UA. Check C&S. Rx macrobid. F/u prn.    Meds ordered this encounter  Medications  . nitrofurantoin, macrocrystal-monohydrate, (MACROBID) 100 MG capsule    Sig: Take 1 capsule (100 mg total) by mouth 2 (two) times daily for 5 days.    Dispense:  10 capsule    Refill:  0    Order Specific Question:   Supervising Provider    Answer:   Gae Dry [[id number]]      Return if symptoms worsen or fail to improve.   B. , PA-C 02/14/2019 4:52 PM

## 2019-02-14 NOTE — Telephone Encounter (Signed)
Patient is schedule for 02/14/19 with ABC

## 2019-02-14 NOTE — Telephone Encounter (Signed)
Pt calling; thinks she has a UTI; can she drop off a sample and get a rx?  [phone]

## 2019-02-15 DIAGNOSIS — N3001 Acute cystitis with hematuria: Secondary | ICD-10-CM | POA: Diagnosis not present

## 2019-02-17 LAB — URINE CULTURE

## 2019-02-19 NOTE — Progress Notes (Signed)
Pls let pt know C&S showed UTI and she is on correct abx. F/u prn. Thx

## 2019-02-20 ENCOUNTER — Other Ambulatory Visit: Payer: Self-pay | Admitting: Obstetrics and Gynecology

## 2019-02-20 MED ORDER — CEPHALEXIN 500 MG PO CAPS: 500.0000 mg | ORAL_CAPSULE | Freq: Two times a day (BID) | ORAL | 0 refills | Status: AC

## 2019-02-20 NOTE — Progress Notes (Signed)
Rx keflex eRxd. F/u prn sx.

## 2019-02-20 NOTE — Progress Notes (Signed)
Pt aware.

## 2019-02-20 NOTE — Progress Notes (Signed)
What sx is she still having? Cloudy and dark urine doesn't have to mean UTI. She sent that to [facility] over wknd. Is she having frequency/urgency/dysuria?

## 2019-02-20 NOTE — Progress Notes (Signed)
Called pt, no answer, LVMTRC. 

## 2019-02-20 NOTE — Progress Notes (Signed)
Keflex for persistent UTI sx

## 2019-03-06 ENCOUNTER — Other Ambulatory Visit: Payer: Self-pay | Admitting: Obstetrics and Gynecology

## 2019-03-06 DIAGNOSIS — E039 Hypothyroidism, unspecified: Secondary | ICD-10-CM

## 2019-03-07 NOTE — Telephone Encounter (Signed)
Pt needs annual in 03/2019

## 2019-05-04 DIAGNOSIS — R69 Illness, unspecified: Secondary | ICD-10-CM | POA: Diagnosis not present

## 2019-05-15 ENCOUNTER — Other Ambulatory Visit: Payer: Self-pay | Admitting: Obstetrics and Gynecology

## 2019-05-15 DIAGNOSIS — Z1231 Encounter for screening mammogram for malignant neoplasm of breast: Secondary | ICD-10-CM

## 2019-06-04 ENCOUNTER — Other Ambulatory Visit: Payer: Self-pay | Admitting: Obstetrics and Gynecology

## 2019-06-04 DIAGNOSIS — E039 Hypothyroidism, unspecified: Secondary | ICD-10-CM

## 2019-06-06 NOTE — Telephone Encounter (Signed)
Patient needs to have an appointment for further refills. She has not had her thyroid checked in over a year.

## 2019-06-09 ENCOUNTER — Other Ambulatory Visit: Payer: Self-pay | Admitting: Obstetrics and Gynecology

## 2019-06-09 DIAGNOSIS — E039 Hypothyroidism, unspecified: Secondary | ICD-10-CM

## 2019-07-01 ENCOUNTER — Other Ambulatory Visit: Payer: Self-pay | Admitting: Obstetrics and Gynecology

## 2019-07-01 DIAGNOSIS — E039 Hypothyroidism, unspecified: Secondary | ICD-10-CM

## 2019-08-02 ENCOUNTER — Ambulatory Visit
Admission: RE | Admit: 2019-08-02 | Discharge: 2019-08-02 | Disposition: A | Discharge status: Home / Self Care | Payer: Medicare HMO | Source: Ambulatory Visit | Attending: Obstetrics and Gynecology | Admitting: Obstetrics and Gynecology

## 2019-08-02 ENCOUNTER — Encounter: Payer: Self-pay | Admitting: Obstetrics and Gynecology

## 2019-08-02 DIAGNOSIS — Z1231 Encounter for screening mammogram for malignant neoplasm of breast: Secondary | ICD-10-CM | POA: Diagnosis not present

## 2019-08-03 DIAGNOSIS — H02889 Meibomian gland dysfunction of unspecified eye, unspecified eyelid: Secondary | ICD-10-CM | POA: Diagnosis not present

## 2019-08-03 DIAGNOSIS — Z0100 Encounter for examination of eyes and vision without abnormal findings: Secondary | ICD-10-CM | POA: Diagnosis not present

## 2019-08-03 DIAGNOSIS — H2513 Age-related nuclear cataract, bilateral: Secondary | ICD-10-CM | POA: Diagnosis not present

## 2019-08-08 ENCOUNTER — Ambulatory Visit: Payer: PRIVATE HEALTH INSURANCE | Attending: Internal Medicine

## 2019-08-08 DIAGNOSIS — Z23 Encounter for immunization: Secondary | ICD-10-CM | POA: Insufficient documentation

## 2019-08-08 NOTE — Progress Notes (Signed)
   Covid-19 Vaccination Clinic  Name:  Tanya Quinn    MRN: [id number] DOB: 1950-07-29  08/08/2019  [name] was observed post Covid-19 immunization for 15 minutes without incidence. She was provided with Vaccine Information Sheet and instruction to access the V-Safe system.   [name] was instructed to call 911 with any severe reactions post vaccine: Marland Kitchen Difficulty breathing  . Swelling of your face and throat  . A fast heartbeat  . A bad rash all over your body  . Dizziness and weakness    Immunizations Administered    Name Date Dose VIS Date Route   Pfizer COVID-19 Vaccine 08/08/2019  1:34 PM 0.3 mL 06/30/2019 Intramuscular   Manufacturer: [place]   Lot: [id number]   [place]: [id number]

## 2019-08-29 ENCOUNTER — Ambulatory Visit: Payer: PRIVATE HEALTH INSURANCE | Attending: Internal Medicine

## 2019-08-29 ENCOUNTER — Ambulatory Visit: Payer: PRIVATE HEALTH INSURANCE

## 2019-08-29 DIAGNOSIS — Z23 Encounter for immunization: Secondary | ICD-10-CM | POA: Insufficient documentation

## 2019-08-29 NOTE — Progress Notes (Signed)
   Covid-19 Vaccination Clinic  Name:  Tanya Quinn    MRN: [id number] DOB: 30-Jan-1951  08/29/2019  [name] was observed post Covid-19 immunization for 15 minutes without incidence. She was provided with Vaccine Information Sheet and instruction to access the V-Safe system.   [name] was instructed to call 911 with any severe reactions post vaccine: Marland Kitchen Difficulty breathing  . Swelling of your face and throat  . A fast heartbeat  . A bad rash all over your body  . Dizziness and weakness    Immunizations Administered    Name Date Dose VIS Date Route   Pfizer COVID-19 Vaccine 08/29/2019  2:56 PM 0.3 mL 06/30/2019 Intramuscular   Manufacturer: [place]   Lot: [id number]   [place]: [id number]

## 2019-09-25 ENCOUNTER — Other Ambulatory Visit: Payer: Self-pay | Admitting: Obstetrics and Gynecology

## 2019-09-25 DIAGNOSIS — R52 Pain, unspecified: Secondary | ICD-10-CM | POA: Diagnosis not present

## 2019-09-25 DIAGNOSIS — S99921A Unspecified injury of right foot, initial encounter: Secondary | ICD-10-CM | POA: Diagnosis not present

## 2019-09-25 DIAGNOSIS — E039 Hypothyroidism, unspecified: Secondary | ICD-10-CM

## 2019-09-25 DIAGNOSIS — M79671 Pain in right foot: Secondary | ICD-10-CM | POA: Diagnosis not present

## 2019-09-25 NOTE — Telephone Encounter (Signed)
Advise

## 2019-10-03 DIAGNOSIS — M79674 Pain in right toe(s): Secondary | ICD-10-CM | POA: Diagnosis not present

## 2019-10-03 DIAGNOSIS — M79641 Pain in right hand: Secondary | ICD-10-CM | POA: Diagnosis not present

## 2019-10-03 DIAGNOSIS — M79642 Pain in left hand: Secondary | ICD-10-CM | POA: Diagnosis not present

## 2019-10-03 DIAGNOSIS — L4050 Arthropathic psoriasis, unspecified: Secondary | ICD-10-CM | POA: Diagnosis not present

## 2019-10-03 DIAGNOSIS — L4052 Psoriatic arthritis mutilans: Secondary | ICD-10-CM | POA: Diagnosis not present

## 2019-10-03 DIAGNOSIS — M7989 Other specified soft tissue disorders: Secondary | ICD-10-CM | POA: Diagnosis not present

## 2019-10-10 NOTE — Telephone Encounter (Signed)
Pt aware via voicemail 

## 2019-10-10 NOTE — Telephone Encounter (Signed)
I'll refill for 90 days. But, I need her to check her thyroid function in that time to give her more medication.  She can schedule an annual or come in for labs.

## 2019-10-19 DIAGNOSIS — L4050 Arthropathic psoriasis, unspecified: Secondary | ICD-10-CM | POA: Diagnosis not present

## 2019-10-19 DIAGNOSIS — M7989 Other specified soft tissue disorders: Secondary | ICD-10-CM | POA: Diagnosis not present

## 2019-10-19 DIAGNOSIS — M79674 Pain in right toe(s): Secondary | ICD-10-CM | POA: Diagnosis not present

## 2020-01-01 ENCOUNTER — Other Ambulatory Visit: Payer: Self-pay | Admitting: Obstetrics and Gynecology

## 2020-01-01 DIAGNOSIS — E039 Hypothyroidism, unspecified: Secondary | ICD-10-CM

## 2020-01-01 NOTE — Telephone Encounter (Signed)
Please advise if refill is appropriate 

## 2020-01-01 NOTE — Telephone Encounter (Signed)
I haven't seen this patient since 2018.  She needs an appointment. I will go ahead and send in a refill in the mean time. However, she needs to be seen if I am to continue to refill this medication.

## 2020-01-11 DIAGNOSIS — L409 Psoriasis, unspecified: Secondary | ICD-10-CM | POA: Diagnosis not present

## 2020-01-11 DIAGNOSIS — L4050 Arthropathic psoriasis, unspecified: Secondary | ICD-10-CM | POA: Diagnosis not present

## 2020-02-08 ENCOUNTER — Other Ambulatory Visit: Payer: Self-pay | Admitting: Obstetrics and Gynecology

## 2020-02-08 DIAGNOSIS — E039 Hypothyroidism, unspecified: Secondary | ICD-10-CM

## 2020-02-08 NOTE — Telephone Encounter (Signed)
advise

## 2020-04-15 ENCOUNTER — Other Ambulatory Visit: Payer: Self-pay | Admitting: Obstetrics and Gynecology

## 2020-04-15 DIAGNOSIS — E039 Hypothyroidism, unspecified: Secondary | ICD-10-CM

## 2020-04-25 DIAGNOSIS — R69 Illness, unspecified: Secondary | ICD-10-CM | POA: Diagnosis not present

## 2020-07-02 DIAGNOSIS — L409 Psoriasis, unspecified: Secondary | ICD-10-CM | POA: Diagnosis not present

## 2020-07-02 DIAGNOSIS — M1812 Unilateral primary osteoarthritis of first carpometacarpal joint, left hand: Secondary | ICD-10-CM | POA: Diagnosis not present

## 2020-07-02 DIAGNOSIS — L4050 Arthropathic psoriasis, unspecified: Secondary | ICD-10-CM | POA: Diagnosis not present

## 2021-09-03 NOTE — Telephone Encounter (Signed)
Formatting of this note might be different from the original.  Ok x 1. Then will need to get from new providers in future  Electronically signed by Dessie Coma., MD at 09/03/2021  4:22 PM EST

## 2021-09-03 NOTE — Telephone Encounter (Signed)
Formatting of this note might be different from the original.  Pt needs refill Synthroid, meloxicam, triamcinolone, Humira sent to [facility] in [place]    Please call back [phone]  Electronically signed by Myer Haff at 09/03/2021 11:38 AM EST

## 2021-09-03 NOTE — Telephone Encounter (Signed)
Formatting of this note might be different from the original.  Please advise on all these refills - she was changing to see a rheumatologist closer to her   Electronically signed by Charleston Ropes, LPN at 96/75/9163  1:26 PM EST

## 2021-09-05 NOTE — Telephone Encounter (Signed)
Formatting of this note might be different from the original.  eprescribed   Electronically signed by Charleston Ropes, LPN at 61/60/7371 11:21 AM EST

## 2021-11-03 ENCOUNTER — Encounter
Payer: MEDICARE | Attending: Student in an Organized Health Care Education/Training Program | Primary: Student in an Organized Health Care Education/Training Program

## 2021-11-04 ENCOUNTER — Ambulatory Visit
Admit: 2021-11-04 | Discharge: 2021-11-04 | Payer: MEDICARE | Attending: Student in an Organized Health Care Education/Training Program | Primary: Student in an Organized Health Care Education/Training Program

## 2021-11-04 ENCOUNTER — Ambulatory Visit
Attending: Student in an Organized Health Care Education/Training Program | Primary: Student in an Organized Health Care Education/Training Program

## 2021-11-04 DIAGNOSIS — Z7689 Persons encountering health services in other specified circumstances: Secondary | ICD-10-CM

## 2021-11-04 MED ORDER — CONJUGATED ESTROGENS 0.625 MG/G VAGINAL CREAM
0.625 mg/gram | VAGINAL | 3 refills | Status: AC
Start: 2021-11-04 — End: ?

## 2021-11-04 NOTE — Progress Notes (Signed)
Please call pt   - urine shows blood, this is probably why she has seen a change in the color. This requires further evaluation by urology. Please call [facility] to schedule appt.  - remainder of labs are good: normal blood counts, kidney function, liver function. Cholesterol levels are very good. Thyroid lab is in the normal range, please continue supplement. Hep c screening is negative.    York Pellant, MD

## 2021-11-04 NOTE — Progress Notes (Signed)
Progress  Notes by Rubin Payor, LPN at 16/07/37 0930                Author: Rubin Payor, LPN  Service: --  Author Type: Licensed Nurse       Filed: 11/06/21 1617  Encounter Date: 11/04/2021  Status: Signed          Editor: Rubin Payor, LPN (Licensed Nurse)               Results reviewed with patient, patient indicated understanding.    Given name and number for urology consult.   Dr Servando Snare [phone].

## 2021-11-04 NOTE — Progress Notes (Signed)
Dawn Quinn is a 71 y.o. year old female who is a new patient to me today (11/04/21).  She was previous followed by Dr Thomasene Mohair with [facility] but has not been seen in several years (>40yr).    Assessment & Plan:   1. Establishing care with new doctor, encounter for  Comments:  reviewed available records from [facility]  2. Acquired hypothyroidism  Comments:  update TSH. Cont synthroid. encouraged her to try generic and if she felt fatigue or other sx to let me know.  Orders:  -     T3, FREE; Future  3. Psoriatic arthritis (HCC)  Comments:  she plans to establish with rheum next month to continue Humira  Orders:  -     CBC WITH AUTOMATED DIFF; Future  -     METABOLIC PANEL, COMPREHENSIVE; Future  -     URINALYSIS W/ REFLEX CULTURE; Future  4. Family history of melanoma  Comments:  refer to derm for skin check  Orders:  -     REFERRAL TO DERMATOLOGY  5. Psoriasis  Comments:  largely controlled on humira. she may speak with rheumatology regarding the need for a dermatologist to manage psoriasis  Orders:  -     REFERRAL TO DERMATOLOGY  6. Vaginal itching  Comments:  urethral itching, urine dark in color. will check UA. will try vaginal estrogen.   Orders:  -     conjugated estrogens (PREMARIN) 0.625 mg/gram vaginal cream; Apply 0.5 g of cream intravaginally daily for 2 weeks, then reduce to twice weekly, Normal, Disp-30 g, R-3  7. Lipid screening  -     LIPID PANEL; Future  8. Encounter for screening mammogram for malignant neoplasm of breast  -     MAM MAMMO BI SCREENING INCL CAD; Future  9. Menopause  -     DEXA BONE DENSITY STUDY AXIAL; Future  10. Encounter for hepatitis C screening test for low risk patient  -     HEPATITIS C AB; Future  11. ACP (advance care planning)  -     REFERRAL TO ACP CLINICAL SPECIALIST         Health Maintenance   Flu vaccine:  COVID vaccine: reports UTD  Tetanus vaccine: due, recommended  Shingles vaccine: reports she received shingrix  Pneumonia vaccine: will check  record  Breast cancer screening: will order   Colon cancer screening: last was about 8 years ago, will try to locate record  DEXA: will order  Hep C: will check today   Lipid: will check today   DM:  Healthcare decision maker: daughter  ACP: will refer, she would like to draft this      RTC: 6 mo medicare annual wellness    Subjective:   Dawn Quinn was seen today for Establish Care    Hypothyroidism  - synthroid, stable dose for years. Typically uses brand name but was recently dispensed generic. She has never taken generic so she is a bit hesitant.     Psoriasis/psoriatic arthritis  - has appointment next month with Tammy Spring  - Humira  - Meloxicam PRN - usually about 1x/week max, usually related to gardening, etc. Taking more lately because of moving from [place]  - topical triamcinolone     Found 2 ticks on her body on Sunday, thinks she got them earlier in the day when she was in the yard. Has a little redness at the site.     Notes she gained some weight recently  and wants to loose it. She has been taking care of other family members and hasn't been putting herself first. She plans to start biking as well.     Review of Systems   All other systems reviewed and are negative.    PMHx    Patient Active Problem List   Diagnosis Code    Acquired hypothyroidism E03.9    Psoriatic arthritis (HCC) L40.50    Psoriasis L40.9       Past Medical History:   Diagnosis Date    Hypothyroidism     Psoriasis     Psoriatic arthritis (HCC)        Prior to Admission medications    Medication Sig Start Date End Date Taking? Authorizing Provider   synthroid 50 mcg tablet Take 1 Tablet by mouth Daily (before breakfast). 10/28/21  Yes Provider, Historical   Humira,CF, Pen 40 mg/0.4 mL injection pen 0.4 mL by SubCUTAneous route every fourteen (14) days. 10/13/21  Yes Provider, Historical   meloxicam (MOBIC) 15 mg tablet Take 1 Tablet by mouth daily as needed. 09/05/21  Yes Provider, Historical   triamcinolone (ARISTOCORT) 0.5 % topical cream  Apply  to affected area two (2) times daily as needed. 09/05/21 09/05/22 Yes Provider, Historical   conjugated estrogens (PREMARIN) 0.625 mg/gram vaginal cream Apply 0.5 g of cream intravaginally daily for 2 weeks, then reduce to twice weekly 11/04/21  Yes Wali Reinheimer, Prudence Davidson, MD       PSHx    History reviewed. No pertinent surgical history.    FH    Family History   Problem Relation Age of Onset    Other Mother         myelofibrosis    Coronary Art Dis Father         several heart attacks    SKIN CANCER Father         melanoma    Alcohol abuse Father     Breast Cancer Neg Hx     Colon Cancer Neg Hx         SH    Social History     Occupational History    Not on file   Tobacco Use    Smoking status: Never    Smokeless tobacco: Never   Substance and Sexual Activity    Alcohol use: Yes     Comment: 1 glass of wine/week    Drug use: Not Currently    Sexual activity: Not on file        The following sections were reviewed & updated as appropriate: Problem List, Allergies, PMH, PSH, FH, and SH.      Objective:   Visit Vitals  BP 138/89   Pulse 78   Temp 98 F (36.7 C) (Temporal)   Resp 20   Ht 5\' 7"  (1.702 m)   Wt 215 lb (97.5 kg)   SpO2 97%   BMI 33.67 kg/m       Physical Exam  Eyes:      Extraocular Movements: Extraocular movements intact.   Cardiovascular:      Rate and Rhythm: Normal rate and regular rhythm.      Heart sounds: No murmur heard.  Pulmonary:      Effort: Pulmonary effort is normal. No respiratory distress.   Abdominal:      General: Bowel sounds are normal.      Palpations: Abdomen is soft.   Musculoskeletal:      Right lower leg: No edema.  Left lower leg: No edema.   Neurological:      General: No focal deficit present.      Mental Status: She is alert.   Psychiatric:         Mood and Affect: Mood normal.         Behavior: Behavior normal.            York Pellant, MD

## 2021-11-05 LAB — METABOLIC PANEL, COMPREHENSIVE
A-G Ratio: 1.1 (ref 1.1–2.2)
ALT (SGPT): 39 U/L (ref 12–78)
AST (SGOT): 23 U/L (ref 15–37)
Albumin: 4.2 g/dL (ref 3.5–5.0)
Alk. phosphatase: 57 U/L (ref 45–117)
Anion gap: 6 mmol/L (ref 5–15)
BUN/Creatinine ratio: 17 (ref 12–20)
BUN: 12 MG/DL (ref 6–20)
Bilirubin, total: 0.8 MG/DL (ref 0.2–1.0)
CO2: 26 mmol/L (ref 21–32)
Calcium: 8.9 MG/DL (ref 8.5–10.1)
Chloride: 107 mmol/L (ref 97–108)
Creatinine: 0.7 MG/DL (ref 0.55–1.02)
Globulin: 3.7 g/dL (ref 2.0–4.0)
Glucose: 91 mg/dL (ref 65–100)
Potassium: 4.2 mmol/L (ref 3.5–5.1)
Protein, total: 7.9 g/dL (ref 6.4–8.2)
Sodium: 139 mmol/L (ref 136–145)
eGFR: >60 mL/min/{1.73_m2} (ref >60)

## 2021-11-05 LAB — URINALYSIS W/ REFLEX CULTURE
BACTERIA, URINE: NEGATIVE /hpf
Bacteria: NEGATIVE /hpf
Bilirubin, Urine: NEGATIVE
Bilirubin: NEGATIVE
Glucose, Ur: NEGATIVE mg/dL
Glucose: NEGATIVE mg/dL
Ketone: NEGATIVE mg/dL
Ketones, Urine: NEGATIVE mg/dL
Leukocyte Esterase, Urine: NEGATIVE
Leukocyte Esterase: NEGATIVE
Nitrite, Urine: NEGATIVE
Nitrites: NEGATIVE
Protein, UA: NEGATIVE mg/dL
Protein: NEGATIVE mg/dL
Specific Gravity, UA: 1.016 (ref 1.003–1.030)
Specific gravity: 1.016 (ref 1.003–1.030)
Urobilinogen, UA, POCT: 0.2 EU/dL (ref 0.2–1.0)
Urobilinogen: 0.2 EU/dL (ref 0.2–1.0)
pH (UA): 6.5 (ref 5.0–8.0)
pH, UA: 6.5 (ref 5.0–8.0)

## 2021-11-05 LAB — CBC WITH AUTOMATED DIFF
ABS. BASOPHILS: 0.1 10*3/uL (ref 0.0–0.1)
ABS. EOSINOPHILS: 0.1 10*3/uL (ref 0.0–0.4)
ABS. IMM. GRANS.: 0 10*3/uL (ref 0.00–0.04)
ABS. LYMPHOCYTES: 1.9 10*3/uL (ref 0.8–3.5)
ABS. MONOCYTES: 0.4 10*3/uL (ref 0.0–1.0)
ABS. NEUTROPHILS: 2.6 10*3/uL (ref 1.8–8.0)
ABSOLUTE NRBC: 0 10*3/uL (ref 0.00–0.01)
BASOPHILS: 1 % (ref 0–1)
EOSINOPHILS: 2 % (ref 0–7)
HCT: 46.5 % (ref 35.0–47.0)
HGB: 14.2 g/dL (ref 11.5–16.0)
IMMATURE GRANULOCYTES: 0 % (ref 0.0–0.5)
LYMPHOCYTES: 38 % (ref 12–49)
MCH: 29.5 PG (ref 26.0–34.0)
MCHC: 30.5 g/dL (ref 30.0–36.5)
MCV: 96.5 FL (ref 80.0–99.0)
MONOCYTES: 8 % (ref 5–13)
MPV: 9.7 FL (ref 8.9–12.9)
NEUTROPHILS: 51 % (ref 32–75)
NRBC: 0 PER 100 WBC
PLATELET: 321 10*3/uL (ref 150–400)
RBC: 4.82 M/uL (ref 3.80–5.20)
RDW: 13.9 % (ref 11.5–14.5)
WBC: 5 10*3/uL (ref 3.6–11.0)

## 2021-11-05 LAB — LIPID PANEL
CHOL/HDL Ratio: 2.9 (ref 0.0–5.0)
Chol/HDL Ratio: 2.9 (ref 0.0–5.0)
Cholesterol, Total: 158 MG/DL (ref <200)
Cholesterol, total: 158 MG/DL (ref <200)
HDL Cholesterol: 54 MG/DL
HDL: 54 MG/DL
LDL Calculated: 73.6 MG/DL (ref 0–100)
LDL, calculated: 73.6 MG/DL (ref 0–100)
Triglyceride: 152 MG/DL — ABNORMAL HIGH (ref <150)
Triglycerides: 152 MG/DL — ABNORMAL HIGH (ref <150)
VLDL Cholesterol Calculated: 30.4 MG/DL
VLDL, calculated: 30.4 MG/DL

## 2021-11-05 LAB — T3, FREE
Free Triiodothyronine (T3): 2.6 pg/mL (ref 2.2–4.0)
T3, Free: 2.6 pg/mL (ref 2.2–4.0)

## 2021-11-05 LAB — HEPATITIS C AB: Hep C virus Ab Interp.: NONREACTIVE

## 2021-11-05 LAB — COMPREHENSIVE METABOLIC PANEL
ALT: 39 U/L (ref 12–78)
AST: 23 U/L (ref 15–37)
Albumin/Globulin Ratio: 1.1 (ref 1.1–2.2)
Albumin: 4.2 g/dL (ref 3.5–5.0)
Alkaline Phosphatase: 57 U/L (ref 45–117)
Anion Gap: 6 mmol/L (ref 5–15)
BUN: 12 MG/DL (ref 6–20)
Bun/Cre Ratio: 17 (ref 12–20)
CO2: 26 mmol/L (ref 21–32)
Calcium: 8.9 MG/DL (ref 8.5–10.1)
Chloride: 107 mmol/L (ref 97–108)
Creatinine: 0.7 MG/DL (ref 0.55–1.02)
ESTIMATED GLOMERULAR FILTRATION RATE: >60 mL/min/{1.73_m2} (ref >60)
Globulin: 3.7 g/dL (ref 2.0–4.0)
Glucose: 91 mg/dL (ref 65–100)
Potassium: 4.2 mmol/L (ref 3.5–5.1)
Sodium: 139 mmol/L (ref 136–145)
Total Bilirubin: 0.8 MG/DL (ref 0.2–1.0)
Total Protein: 7.9 g/dL (ref 6.4–8.2)

## 2021-11-05 LAB — CBC WITH AUTO DIFFERENTIAL
Basophils %: 1 % (ref 0–1)
Basophils Absolute: 0.1 10*3/uL (ref 0.0–0.1)
Eosinophils %: 2 % (ref 0–7)
Eosinophils Absolute: 0.1 10*3/uL (ref 0.0–0.4)
Granulocyte Absolute Count: 0 10*3/uL (ref 0.00–0.04)
Hematocrit: 46.5 % (ref 35.0–47.0)
Hemoglobin: 14.2 g/dL (ref 11.5–16.0)
Immature Granulocytes: 0 % (ref 0.0–0.5)
Lymphocytes %: 38 % (ref 12–49)
Lymphocytes Absolute: 1.9 10*3/uL (ref 0.8–3.5)
MCH: 29.5 PG (ref 26.0–34.0)
MCHC: 30.5 g/dL (ref 30.0–36.5)
MCV: 96.5 FL (ref 80.0–99.0)
MPV: 9.7 FL (ref 8.9–12.9)
Monocytes %: 8 % (ref 5–13)
Monocytes Absolute: 0.4 10*3/uL (ref 0.0–1.0)
NRBC Absolute: 0 10*3/uL (ref 0.00–0.01)
Neutrophils %: 51 % (ref 32–75)
Neutrophils Absolute: 2.6 10*3/uL (ref 1.8–8.0)
Nucleated RBCs: 0 PER 100 WBC
Platelets: 321 10*3/uL (ref 150–400)
RBC: 4.82 M/uL (ref 3.80–5.20)
RDW: 13.9 % (ref 11.5–14.5)
WBC: 5 10*3/uL (ref 3.6–11.0)

## 2021-11-05 LAB — HEPATITIS C ANTIBODY: Hepatitis C Ab: NONREACTIVE

## 2021-11-05 NOTE — ACP (Advance Care Planning) (Addendum)
ACP (Advance Care Planning) by Lorenda Ishihara at 11/05/21 1634                Author: Lorenda Ishihara  Service: --  Author Type: HIM PROVIDER       Filed: 11/13/21 1155  Encounter Date: 11/05/2021  Status: Addendum          Editor: Lorenda Ishihara (HIM PROVIDER)          Related Notes: Original Note by Lorenda Ishihara (HIM PROVIDER) filed at 11/05/21 1636                     Advance Care Planning    Ambulatory ACP Specialist Patient Outreach      Date:  11/05/2021      ACP Specialist:  Lorenda Ishihara      Outreach call to patient in follow-up to ACP Specialist referral from:  York Pellant, MD      [x]  PCP  []  Provider   []   Ambulatory Care Management []  Other       For:                   [x]  Advance Directive Assistance               []  Complete Portable DNR order               []  Complete POST/POLST/MOST               []  Code Status Discussion              []  Discuss Goals of Care              []  Early ACP Decision-Making               []  Other (Specify)      Date Referral Received:  11/04/2021      Next Step:   []   ACP scheduled conversation  [x]  Outreach again in  one week                []  Email / Mail ACP Info Sheets  []  Email / Mail Advance Directive    []   Closing referral.  Routing closure to referring provider/staff and to ACP .     []  Closure letter mailed to patient with invitation  to contact ACP Specialist if / when ready.    []  Other (Specify here):        Outreaches:        [x]   1st -  Date:  11/05/2021                                Intervention:  []  Spoke with Patient   [x]   Left Voice mail []  Email / Mail    []  MyChart  []   Other (Specify) :                      Outcomes:  Attempted ACP outreach  - no answer.  Left VM requesting return call regarding referral received by ACP team from physician's office. Will attempt outreach again in one week if return call not received.              [x]   2nd -  Date:  11/13/2021  Intervention:  [x]  Spoke with Patient   []   Left Voice mail []  Email / Mail    []  MyChart  []   Other (Specify) :                               Outcomes:  Patient is agreeable to a conversation with ACP Specialist on Tuesday, 11/18/2021 at  9:00 AM.  Copy of VA AMD and ACP information sheets sent to patient's confirmed email address on file.                      []   3rd -  Date:                                   Intervention:  []   Spoke with Patient   []  Left Voice mail []  Email / Mail    []   MyChart  []  Other (Specify) :                        Outcomes:                []    Additional Outreach -  Date:        (Specify Dates & special circumstances):                      Outcomes:                  Thank you for this referral.

## 2021-11-09 ENCOUNTER — Encounter

## 2021-11-17 ENCOUNTER — Inpatient Hospital Stay
Admit: 2021-11-17 | Payer: MEDICARE | Attending: Student in an Organized Health Care Education/Training Program | Primary: Student in an Organized Health Care Education/Training Program

## 2021-11-17 DIAGNOSIS — Z780 Asymptomatic menopausal state: Secondary | ICD-10-CM

## 2021-11-17 DIAGNOSIS — Z1231 Encounter for screening mammogram for malignant neoplasm of breast: Secondary | ICD-10-CM

## 2021-11-18 NOTE — ACP (Advance Care Planning) (Signed)
Advance Care Planning   Ambulatory ACP Specialist Patient Outreach    Date:  11/18/2021    ACP Specialist:  Guadlupe Spanish, LCSW    Outreach call to patient in follow-up to ACP Specialist referral from:    [x]  PCP  []  Provider   []  Ambulatory Care Management []  Other     For:                  [x]  Advance Directive Assistance              []  Complete Portable DNR order              []  Complete POST/POLST/MOST              []  Code Status Discussion             []  Discuss Goals of Care             []  Early ACP Decision-Making              []  Other (Specify)    Date Referral Received:11/04/2021    Next Step:   [x]  ACP scheduled conversation  []  Outreach again in one week               []  Email / Mail ACP Info Sheets  []  Email / Mail Advance Directive   []  Closing referral.  Routing closure to referring provider/staff and to .    []  Closure letter mailed to patient with invitation to contact ACP Specialist if / when ready.   []  Other (Specify here):      Outreaches:  [x]   Additional Outreach -  Date:  11/17/2021 and 11/18/21   (Specify Dates & special circumstances): Spoke with patient on phone    Outcomes: ACP Specialist called patient for an appointment reminder call on 11/17/2021 for appointment scheduled on 11/18/2021 at 9:00am.  Pt confirmed appointment date and time, but called back on the morning of 11/18/2021, reporting that she had something come up and could not participate in scheduled ACP appointment today. Pt has asked to reschedule and appointment was rescheduled for Wednesday Nov 19, 2021 at 8:30am.  Pt reported that she did not receive emailed documents. Email address reviewed and patient indicated that email was incorrect.  Email updated to  the correct email address in medical record at patient's request.  Pt has been emailed ACP documents for scheduled appointment.        Thank you for this referral.    . , LISW-CP  Advance Care Planning  Specialist  [phone]

## 2021-11-19 NOTE — ACP (Advance Care Planning) (Signed)
Formatting of this note might be different from the original.  [place] Clinical Specialist  Conversation Note    Date of ACP Conversation: 11/19/21    Conversation Conducted with:  Patient with Decision Making Capacity    ACP Clinical Specialist: Odis Luster, [place] Decision Maker:    Current Designated Health Care Decision Maker:     Primary Decision Maker: Dawn Quinn, Dawn Quinn - Daughter - [phone]    Secondary Decision Maker: Dawn Quinn - Daughter - [phone]    Care Preferences    Hospitalization:  "If your health worsens and it becomes clear that your chance of recovery is unlikely, what would your preference be regarding hospitalization?"    Choice:  []   The patient wants hospitalization  [x]   The patient prefers comfort-focused treatment without hospitalization.    Ventilation:  "If you were in your present state of health and suddenly became very ill and were unable to breathe on your own, what would your preference be about the use of a ventilator (breathing machine) if it were available to you?"      If patient would desire the use of a ventilator (breathing machine), answer "yes", if not "no":yes    "If your health worsens and it becomes clear that your chance of recovery is unlikely, what would your preference be about the use of a ventilator (breathing machine) if it were available to you?"     Would the patient desire the use of a ventilator (breathing machine)?  NO    Resuscitation  "CPR works best to restart the heart when there is a sudden event, like a heart attack, in someone who is otherwise healthy. Unfortunately, CPR does not typically restart the heart for people who have serious health conditions or who are very sick."    "In the event your heart stopped as a result of an underlying serious health condition, would you want attempts to be made to restart your heart (answer "yes" for attempt to resuscitate) or would you  prefer a natural death (answer "no" for do not attempt to resuscitate)?" yes    [x]  Yes  []  No   Educated Patient / Decision Maker regarding differences between Advance Directives and portable DNR orders.    Length of ACP Conversation in minutes:  65    Conversation Outcomes:  [x]  ACP discussion completed  []  Existing advance directive reviewed with patient; no changes to patient's previously recorded wishes   [x]  New Advance Directive completed   []  Portable Do Not Resuscitate prepared for Provider review and signature  []  POLST/POST/MOLST/MOST prepared for Provider review and signature    Follow-up plan:    []  Schedule follow-up conversation to continue planning  []  Referred individual to Provider for additional questions/concerns   [x]  Advised patient/agent/surrogate to review completed ACP document and update if needed with changes in condition, patient preferences or care setting   [x]  This note routed to one or more involved healthcare providers    ACP Specialist met with patient for scheduled Advance Care Planning appointment.  Pt was able to complete full ACP discussion and the [place] for [facility].  Pt presented as alert and oriented throughout this entire discussion and provided consistent answers regarding her medical decisions and wishes.    Pt has stated that if her health should decline to the point of no hope for recovery, that she would want to have comfort  focused treatment without hospitalization.  Pt has stated that she would want the use of CPR and the ventilator in her current condition, but would not want the use of a ventilator if there were no hope for recovery.    Pt has named her daughter, Dawn Quinn, as her primary agent/health care decision maker and her daughter, Dawn Quinn, as her successor agent/HCDM.  In the [place] AMD, in Section II (My Health Care Instructions), pt has stated that she would not want any treatments to prolong her life in both  scenario #1 and #2. 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    Plan: Completed VA AMD was sent to patient via DocuSign for her electronic signature.  Pt has signed and returned VA AMD and this has been uploaded into her medical record.  This referral will be closed.    Hansel Starling. Karsten Fells, LISW-CP  Advance Care Planning Specialist  [phone]        Electronically signed by Odis Luster, LCSW at 11/19/2021  1:27 PM EDT

## 2021-11-19 NOTE — ACP (Advance Care Planning) (Signed)
Advance Care Planning     Advance Care Planning Clinical Specialist  Conversation Note      Date of ACP Conversation: 11/19/21    Conversation Conducted with:  Patient with Decision Making Capacity    ACP Clinical Specialist: Odis Luster, [place] Decision Maker:    Current Designated Health Care Decision Maker:     Primary Decision Maker: Teah, [place] - Daughter - [phone]    Secondary Decision Maker: Candie Mile - Daughter - [phone]    Care Preferences    Hospitalization:  "If your health worsens and it becomes clear that your chance of recovery is unlikely, what would your preference be regarding hospitalization?"    Choice:  []   The patient wants hospitalization  [x]   The patient prefers comfort-focused treatment without hospitalization.    Ventilation:  "If you were in your present state of health and suddenly became very ill and were unable to breathe on your own, what would your preference be about the use of a ventilator (breathing machine) if it were available to you?"      If patient would desire the use of a ventilator (breathing machine), answer "yes", if not "no":yes    "If your health worsens and it becomes clear that your chance of recovery is unlikely, what would your preference be about the use of a ventilator (breathing machine) if it were available to you?"     Would the patient desire the use of a ventilator (breathing machine)?  NO      Resuscitation  "CPR works best to restart the heart when there is a sudden event, like a heart attack, in someone who is otherwise healthy. Unfortunately, CPR does not typically restart the heart for people who have serious health conditions or who are very sick."    "In the event your heart stopped as a result of an underlying serious health condition, would you want attempts to be made to restart your heart (answer "yes" for attempt to resuscitate) or would you prefer a natural death (answer "no" for do not attempt to  resuscitate)?" yes      [x]  Yes  []  No   Educated Patient / Decision Maker regarding differences between Advance Directives and portable DNR orders.    Length of ACP Conversation in minutes:  29    Conversation Outcomes:  [x]  ACP discussion completed  []  Existing advance directive reviewed with patient; no changes to patient's previously recorded wishes   [x]  New Advance Directive completed   []  Portable Do Not Resuscitate prepared for Provider review and signature  []  POLST/POST/MOLST/MOST prepared for Provider review and signature      Follow-up plan:    []  Schedule follow-up conversation to continue planning  []  Referred individual to Provider for additional questions/concerns   [x]  Advised patient/agent/surrogate to review completed ACP document and update if needed with changes in condition, patient preferences or care setting   [x]  This note routed to one or more involved healthcare providers    ACP Specialist met with patient for scheduled Advance Care Planning appointment.  Pt was able to complete full ACP discussion and the [place] for [facility].  Pt presented as alert and oriented throughout this entire discussion and provided consistent answers regarding her medical decisions and wishes.    Pt has stated that if her health should decline to the point of no hope for recovery, that she would want to have comfort focused treatment without  hospitalization.  Pt has stated that she would want the use of CPR and the ventilator in her current condition, but would not want the use of a ventilator if there were no hope for recovery.    Pt has named her daughter, Tinika Bucknam, as her primary agent/health care decision maker and her daughter, Bergen Melle, as her successor agent/HCDM.  In the [place] AMD, in Section II (My Health Care Instructions), pt has stated that she would not want any treatments to prolong her life in both scenario #1 and #2. Pt states that she is currently an  organs, eyes and tissues donor and would like to continue to be and this was reflected on her VA AMD document.    Plan: Completed VA AMD was sent to patient via DocuSign for her electronic signature.  Pt has signed and returned VA AMD and this has been uploaded into her medical record.  This referral will be closed.      Hansel Starling. Karsten Fells, LISW-CP  Advance Care Planning Specialist  [phone]

## 2021-11-28 NOTE — Telephone Encounter (Signed)
I have a mammogram results but no bone density testing result.  In [facility] it says in progress. Let's give them a bit more time to read the study.    Frances Maywood, MD

## 2021-11-28 NOTE — Telephone Encounter (Signed)
Reason for call:    Patient had a bone density test on 11/17/21, at [facility]. She would like to know if we received the results yet. She has been having trouble signing into her MyChart.    Is this a new problem: yes    Date of last appointment:  11/04/2021     Can we respond via MyChart: No    Best call back number:     Thornton Papas - [phone]

## 2021-11-28 NOTE — Telephone Encounter (Signed)
Advised pt Dr Sherryle Lis has her mammogram results but no bone density testing result.  In [facility] it says in progress. Let's give them a bit more time to read the study. Will call her with results.

## 2021-12-02 NOTE — Telephone Encounter (Signed)
Reason for call:  the images for the bone density have not been read by the radiologist.  Once they are read they will be sent.    Is this a new problem: Yes    Date of last appointment:  11/04/2021     Can we respond via MyChart: No    Best call back number:

## 2021-12-02 NOTE — Telephone Encounter (Signed)
-----   Message from Frances Maywood, MD sent at 12/02/2021  8:20 AM EDT -----  Regarding: DEXA not resulted  Please call [facility] and find out why her DEXA has not resulted. It was done 11/17/21 before the transition to epic and I did not receive the result in [place] or Epic.    Frances Maywood, MD

## 2021-12-02 NOTE — Telephone Encounter (Signed)
[place] at [facility].  Had to leave message in regards to pt's DEXA done on 11/17/21 has not resulted.  Please call office.

## 2021-12-03 NOTE — Telephone Encounter (Signed)
[place] - spoke with Shirlean Mylar in regards to pt's DEXA results. She advised me they were waiting on prior results from another facility - which they received yesterday. Radiologist should have it completed today. Will forward to MD.

## 2021-12-03 NOTE — Telephone Encounter (Signed)
-----   Message from Frances Maywood, MD sent at 12/02/2021  8:20 AM EDT -----  Regarding: DEXA not resulted  Please call [facility] and find out why her DEXA has not resulted. It was done 11/17/21 before the transition to epic and I did not receive the result in [place] or Epic.    Frances Maywood, MD

## 2022-03-17 NOTE — Telephone Encounter (Signed)
Reason for call:  pt would like to go back on synthroid, current med -levothyroxine, leaves her with a bad taste and less energy.  Please send the new Rx to [facility]  Could it be a 3 mo supply please  Is this a new problem: Yes    Date of last appointment:  11/04/2021     Can we respond via MyChart: No    Best call back number:    Dawn, Quinn (Self) [phone] (Home)

## 2022-03-19 ENCOUNTER — Telehealth

## 2022-03-19 MED ORDER — SYNTHROID 50 MCG PO TABS
50 MCG | ORAL_TABLET | Freq: Every day | ORAL | 1 refills | Status: DC
Start: 2022-03-19 — End: 2022-08-25

## 2022-03-19 NOTE — Telephone Encounter (Signed)
Advised pt MD has sent brand name synthroid to her pharmacy.

## 2022-03-19 NOTE — Telephone Encounter (Signed)
Please let her know I sent brand name synthroid to her pharmacy.    York Pellant, MD

## 2022-03-19 NOTE — Telephone Encounter (Signed)
Reason for call:  pt is following up on her request to change her levothyroxine back to synthroid.  90 day supply.  She will run out of medication on Sat and her pharmacy will be closed Sunday and Monday.  Please call and advise when this is done.  Second call    Is this a new problem: Yes    Date of last appointment:  11/04/2021     Can we respond via MyChart: No    Best call back number:    Dawn, Quinn (Self) [phone] (Home)

## 2022-05-07 ENCOUNTER — Encounter
Attending: Student in an Organized Health Care Education/Training Program | Primary: Student in an Organized Health Care Education/Training Program

## 2022-05-07 ENCOUNTER — Ambulatory Visit
Admit: 2022-05-07 | Discharge: 2022-05-07 | Payer: MEDICARE | Attending: Student in an Organized Health Care Education/Training Program | Primary: Student in an Organized Health Care Education/Training Program

## 2022-05-07 ENCOUNTER — Encounter

## 2022-05-07 DIAGNOSIS — Z0000 Encounter for general adult medical examination without abnormal findings: Secondary | ICD-10-CM

## 2022-05-07 NOTE — Patient Instructions (Addendum)
Please obtain the following vaccines from your local pharmacy. Please ask your pharmacy to fax our office a copy of the vaccination record. Our fax number is [phone].   - tetanus vaccine - due every 10 years  - COVID booster   - prevnar 20 - pneumonia vaccine     Personalized Preventive Plan for Dawn Quinn - 05/07/2022  Medicare offers a range of preventive health benefits. Some of the tests and screenings are paid in full while other may be subject to a deductible, co-insurance, and/or copay.    Some of these benefits include a comprehensive review of your medical history including lifestyle, illnesses that may run in your family, and various assessments and screenings as appropriate.    After reviewing your medical record and screening and assessments performed today your provider may have ordered immunizations, labs, imaging, and/or referrals for you.  A list of these orders (if applicable) as well as your Preventive Care list are included within your After Visit Summary for your review.    Other Preventive Recommendations:    A preventive eye exam performed by an eye specialist is recommended every 1-2 years to screen for glaucoma; cataracts, macular degeneration, and other eye disorders.  A preventive dental visit is recommended every 6 months.  Try to get at least 150 minutes of exercise per week or 10,000 steps per day on a pedometer .  Order or download the FREE "Exercise & Physical Activity: Your Everyday Guide" from [employer]. Call [phone] or search [employer] online.  You need 1200-1500 mg of calcium and 1000-2000 IU of vitamin D per day. It is possible to meet your calcium requirement with diet alone, but a vitamin D supplement is usually necessary to meet this goal.  When exposed to the sun, use a sunscreen that protects against both UVA and UVB radiation with an SPF of 30 or greater. Reapply every 2 to 3 hours or after sweating, drying off with  a towel, or swimming.  Always wear a seat belt when traveling in a car. Always wear a helmet when riding a bicycle or motorcycle.

## 2022-05-07 NOTE — Progress Notes (Signed)
Medicare Annual Wellness Visit    Dawn Quinn is here for Medicare AWV    Assessment & Plan   Medicare annual wellness visit, subsequent  - records request sent to [facility] Director to attempt to get vaccine record for Td and shingles   Acquired hypothyroidism  Cont levothyroxine daily. Update TSH to assess control. Unfortunately this was not done with last lab draw  -     TSH; Future  Colon cancer screening  -     [facility]FL - Gerald Dexter, MD, Gastroenterology, [place] ([address])    Recommendations for Preventive Services Due: see orders and patient instructions/AVS.  Recommended screening schedule for the next 5-10 years is provided to the patient in written form: see Patient Instructions/AVS.     Return in 1 year (on 05/08/2023) for medicare annual.     Subjective   The following acute and/or chronic problems were also addressed today:    Hypothyroidism - taking levothyroxine    Vaginal itching - trial of vaginal estrogen helped but she stopped when sx improved. Now established with Dr Elta Guadeloupe who gave her a steroid cream to use PRN).   She had [place] for ovarian cyst found incidentally with uro work-up. It was stable on repeat imaging    Asymtpomatic microscopic hematuira - Uro Dr Alberteen Spindle - work-up negative    Psoriatic arthritis - humira. Rheum Dr Shona Needles Dr Debbrah Alar - will see in Dec 2023    Dentist Dr Lance Sell  Optho Dr Irish Elders    Patient's complete Health Risk Assessment and screening values have been reviewed and are found in Flowsheets. The following problems were reviewed today and where indicated follow up appointments were made and/or referrals ordered.    Positive Risk Factor Screenings with Interventions:               General HRA Questions:  Select all that apply: (!) New or Increased Pain    Pain Interventions:  Patient comments: last Friday she did a lot of gardening and had more pain in her hand from OA. She took meloxicam and it got better        Weight and  Activity:  Physical Activity: Sufficiently Active (05/07/2022)    Exercise Vital Sign     Days of Exercise per Week: 7 days     Minutes of Exercise per Session: 30 min     On average, how many days per week do you engage in moderate to strenuous exercise (like a brisk walk)?: 7 days  Have you lost any weight without trying in the past 3 months?: No  Body mass index is 35.05 kg/m. (!) Abnormal  Obesity Interventions:  Patient comments: she is trying to "eat healthy" - no soda or sweets.              Safety:  Do you have either shower bars, grab bars, non-slip mats or non-slip surfaces in your shower or bathtub?: (!) No  Interventions:  Patient comments: has non-slip bath mat and uses walk in shower with tile that is not slippery           Flu vaccine: 04/21/2022  COVID vaccine: plans to get booster soon  Tetanus vaccine: she is trying to see when she had her last dose - will check with GYN in [place]  Shingles vaccine: reports she received in [place] and pharmacy shut down - [place] send record to county health director  Pneumonia vaccine:  PCV13 07/2015   Breast cancer screening: 11/2021   Colon cancer screening: 02/2010 - will refer   DEXA: 11/2021 osteopenia low FRAX  Hep C: 10/2021  Lipid: 10/2021  DM:  Healthcare decision maker: daughter  ACP: on file          Objective   Vitals:    05/07/22 0936 05/07/22 0946   BP: (!) 149/90 (!) 134/91   Pulse: 98    Resp: 16    Temp: 98 F (36.7 C)    TempSrc: Temporal    SpO2: 96%    Weight: 98 kg (216 lb)    Height: 1.672 m (5' 5.83")       Body mass index is 35.05 kg/m.        Physical Exam  Eyes:      Extraocular Movements: Extraocular movements intact.   Cardiovascular:      Rate and Rhythm: Normal rate and regular rhythm.      Heart sounds: No murmur heard.  Pulmonary:      Effort: No respiratory distress.      Breath sounds: Normal breath sounds.   Abdominal:      General: Bowel sounds are normal.      Palpations: Abdomen is soft.   Musculoskeletal:      Right lower leg: No edema.       Left lower leg: No edema.   Neurological:      General: No focal deficit present.      Mental Status: She is alert.      Gait: Gait normal.   Psychiatric:         Mood and Affect: Mood normal.         Behavior: Behavior normal.            Allergies   Allergen Reactions    Sulfa Antibiotics      Prior to Visit Medications    Medication Sig Taking? Authorizing Provider   UNABLE TO FIND Osteobioflex- once per day Yes [provider]   VITAMIN D PO Take 500 mg by mouth daily Yes [provider]   meloxicam (MOBIC) 15 MG tablet Take 1 tablet by mouth as needed for Pain Yes [provider]   adalimumab (HUMIRA) 40 MG/0.8ML injection Inject 0.8 mLs into the skin every 14 days Yes [provider]   SYNTHROID 50 MCG tablet Take 1 tablet by mouth Daily Yes Dawn Quinn, Dawn Royal, MD       CareTeam (Including outside providers/suppliers regularly involved in providing care):   Patient Care Team:  Frances Maywood, MD as PCP - General  Tinaya Ceballos, Dawn Royal, MD as PCP - Empaneled Provider     Reviewed and updated this visit:  Tobacco  Allergies  Meds  Problems  Med Hx  Surg Hx  Soc Hx  Fam Hx

## 2022-05-08 LAB — TSH: TSH, 3rd Generation: 4.64 u[IU]/mL — ABNORMAL HIGH (ref 0.36–3.74)

## 2022-05-10 NOTE — Other (Signed)
TSH slightly above reference range, stable from 09/2020 with previous PCP. Will send her [facility]. Will not make any medication adjustments unless she has sx to support.

## 2022-08-20 ENCOUNTER — Encounter

## 2022-08-21 NOTE — Telephone Encounter (Signed)
FYI sent to PCP.

## 2022-08-25 ENCOUNTER — Encounter

## 2022-08-25 MED ORDER — SYNTHROID 50 MCG PO TABS
50 MCG | ORAL_TABLET | Freq: Every day | ORAL | 1 refills | Status: AC
Start: 2022-08-25 — End: 2023-02-18

## 2022-08-25 NOTE — Telephone Encounter (Signed)
Pt last seen on 05/07/22. Due to return in one year.    Has appt on 05/10/23.    Rx last filled on  03/19/22 #90/1RF.    Rx sent to pharmacy as previously filled and verified by Verbal Order Read Back with provider.

## 2023-02-18 ENCOUNTER — Encounter

## 2023-02-18 MED ORDER — SYNTHROID 50 MCG PO TABS
50 MCG | ORAL_TABLET | Freq: Every day | ORAL | 0 refills | Status: AC
Start: 2023-02-18 — End: 2023-05-21

## 2023-02-18 NOTE — Telephone Encounter (Signed)
Pt last seen on 05/07/22. Due to return in one year.    Has appt on 05/10/23.    Rx last filled on 08/25/22 #90/1RF.    Rx sent to pharmacy as #90/0RF and verified by Verbal Order Read Back with provider.

## 2023-02-24 ENCOUNTER — Encounter

## 2023-04-21 ENCOUNTER — Encounter

## 2023-04-21 NOTE — Telephone Encounter (Signed)
 Pt last seen on 05/07/22. Due to return in one year.    Has appt on 05/10/23.    Rx last filled on 02/18/23 #90/0RF.

## 2023-05-10 ENCOUNTER — Encounter

## 2023-05-10 ENCOUNTER — Ambulatory Visit
Admit: 2023-05-10 | Discharge: 2023-05-10 | Payer: MEDICARE | Attending: Student in an Organized Health Care Education/Training Program | Primary: Student in an Organized Health Care Education/Training Program

## 2023-05-10 DIAGNOSIS — Z0000 Encounter for general adult medical examination without abnormal findings: Secondary | ICD-10-CM

## 2023-05-10 NOTE — Progress Notes (Signed)
Medicare Annual Wellness Visit    Dawn Quinn is here for Medicare AWV    Assessment & Plan   Medicare annual wellness visit, subsequent  Acquired hypothyroidism  Assessment & Plan:  Will update TSH to assess control. She is taking levothyroxine.    Orders:  -     TSH; Future  Osteopenia of multiple sites  Assessment & Plan:  She is taking Vit D. Encouraged weight bearing exercise. Can repeat DEXA next year.    Psoriatic arthritis [facility])  Assessment & Plan:  Followed by rheum Dr Alvy Beal. She takes humira and PRN mobic     Recommendations for Preventive Services Due: see orders and patient instructions/AVS.  Recommended screening schedule for the next 5-10 years is provided to the patient in written form: see Patient Instructions/AVS.     Return in 1 year (on 05/09/2024) for Medicare AWV.     Subjective   The following acute and/or chronic problems were also addressed today:    Feeling well today, no new issues    Hypothyroidism - taking levothyroxine    Osteopenia - takes vit D. She started walking last week, 1.3 mi/day. She bought a stand for her bike so hoping she can do this.      Psoriatic arthritis - humira. Rheum Dr Alvy Beal. Had CBC and CMP done 02/2023 that were normal     Derm Dr Debbrah Alar - had Mohs surgery on lip but turned out benign.     GYN Dr Elta Guadeloupe  - topical steroid cream to use PRN for vaginal itching  - ovarian cyst stable on imaging - was told not to worry about this     Asymtpomatic microscopic hematuira - Uro Dr Alberteen Spindle at [facility] in 2023 - work-up negative    Dentist Dr Lance Sell  Optho Dr Irish Elders    Patient's complete Health Risk Assessment and screening values have been reviewed and are found in Flowsheets. The following problems were reviewed today and where indicated follow up appointments were made and/or referrals ordered.    Positive Risk Factor Screenings with Interventions:                  Abnormal BMI (obese):  Body mass index is 34.8 kg/m. (!)  Abnormal  Interventions:  Patient comments: trying to exercise regularly. Eats eggs and bacon for breakfast. Lunch is heaviest meal - meat loaf and veggies. Dinner is baked spaghetti. Drinks water primarily                     Flu vaccine: reports vaccine 04/2023  COVID vaccine: 04/2023  RSV: 04/21/2022  Tetanus vaccine: due, will get booster from pharmacy   Shingles vaccine: reports she received in [place] and pharmacy shut down   Pneumonia vaccine: PCV13 07/2015 - needs PCV20  Breast cancer screening: 04/2023 with Dr Elta Guadeloupe - will request    Colon cancer screening: 12/2022 - will request from from Dr Maple Hudson. Was told Q69yr  DEXA: 11/2021 osteopenia low FRAX  Hep C: 10/2021   Lipid: 10/2021 Q4yr  DM:  Healthcare decision maker: daughter  ACP: on file        Objective   Vitals:    05/10/23 1011 05/10/23 1014   BP: (!) 144/92 127/85   Pulse: 81    Resp: 16    Temp: 97.5 F (36.4 C)    TempSrc: Temporal    SpO2: 99%    Weight: 100 kg (220 lb 6.4 oz)  Height: 1.695 m (5' 6.73")       Body mass index is 34.8 kg/m.        Physical Exam  Constitutional:       Appearance: She is normal weight.   Eyes:      Extraocular Movements: Extraocular movements intact.   Cardiovascular:      Rate and Rhythm: Normal rate and regular rhythm.      Heart sounds: No murmur heard.  Pulmonary:      Effort: No respiratory distress.      Breath sounds: Normal breath sounds.   Abdominal:      General: Bowel sounds are normal.      Palpations: Abdomen is soft.   Musculoskeletal:      Right lower leg: No edema.      Left lower leg: No edema.   Neurological:      Mental Status: Mental status is at baseline.   Psychiatric:         Mood and Affect: Mood normal.         Behavior: Behavior normal.                 Allergies   Allergen Reactions    Sulfa Antibiotics      Prior to Visit Medications    Medication Sig Taking? Authorizing Provider   SYNTHROID 50 MCG tablet TAKE 1 TABLET BY MOUTH DAILY Yes Makahla Kiser, Prudence Davidson, MD   UNABLE TO FIND Osteobioflex- once  per day Yes [provider]   VITAMIN D PO Take 500 mg by mouth daily Yes [provider]   meloxicam (MOBIC) 15 MG tablet Take 1 tablet by mouth as needed for Pain Yes [provider]   adalimumab (HUMIRA) 40 MG/0.8ML injection Inject 0.8 mLs into the skin every 14 days Yes [provider]       CareTeam (Including outside providers/suppliers regularly involved in providing care):   Patient Care Team:  York Pellant, MD as PCP - General  Renley Banwart, Prudence Davidson, MD as PCP - Empaneled Provider      Reviewed and updated this visit:  Tobacco  Allergies  Meds  Problems  Med Hx  Surg Hx  Soc Hx  Fam Hx

## 2023-05-10 NOTE — Assessment & Plan Note (Signed)
Followed by rheum Dr Alvy Beal. She takes humira and PRN mobic

## 2023-05-10 NOTE — Assessment & Plan Note (Signed)
Will update TSH to assess control. She is taking levothyroxine.

## 2023-05-10 NOTE — Assessment & Plan Note (Signed)
She is taking Vit D. Encouraged weight bearing exercise. Can repeat DEXA next year.

## 2023-05-10 NOTE — Patient Instructions (Addendum)
D3 2000 IU (50 mcg) daily    Please obtain the following vaccines from your local pharmacy. Please ask your pharmacy to fax our office a copy of the vaccination record. Our fax number is [phone].   - tetanus booster  - prevnar 20 pneumonia vaccine    Personalized Preventive Plan for Dawn Quinn - 05/10/2023  Medicare offers a range of preventive health benefits. Some of the tests and screenings are paid in full while other may be subject to a deductible, co-insurance, and/or copay.    Some of these benefits include a comprehensive review of your medical history including lifestyle, illnesses that may run in your family, and various assessments and screenings as appropriate.    After reviewing your medical record and screening and assessments performed today your provider may have ordered immunizations, labs, imaging, and/or referrals for you.  A list of these orders (if applicable) as well as your Preventive Care list are included within your After Visit Summary for your review.    Other Preventive Recommendations:    A preventive eye exam performed by an eye specialist is recommended every 1-2 years to screen for glaucoma; cataracts, macular degeneration, and other eye disorders.  A preventive dental visit is recommended every 6 months.  Try to get at least 150 minutes of exercise per week or 10,000 steps per day on a pedometer .  Order or download the FREE "Exercise & Physical Activity: Your Everyday Guide" from [employer]. Call 607-686-7854 or search [employer] online.  You need 1200-1500 mg of calcium and 1000-2000 IU of vitamin D per day. It is possible to meet your calcium requirement with diet alone, but a vitamin D supplement is usually necessary to meet this goal.  When exposed to the sun, use a sunscreen that protects against both UVA and UVB radiation with an SPF of 30 or greater. Reapply every 2 to 3 hours or after sweating, drying off with a towel, or  swimming.  Always wear a seat belt when traveling in a car. Always wear a helmet when riding a bicycle or motorcycle.

## 2023-05-11 ENCOUNTER — Encounter

## 2023-05-11 LAB — TSH: TSH, 3rd Generation: 5.07 u[IU]/mL — ABNORMAL HIGH (ref 0.36–3.74)

## 2023-05-11 NOTE — Other (Signed)
Please call lab to add on free T4 - ordered.

## 2023-05-12 LAB — T4, FREE: T4 Free: 1 ng/dL (ref 0.8–1.5)

## 2023-05-12 NOTE — Other (Signed)
Will send [facility] - can cont current dose levothyroxine.

## 2023-05-20 ENCOUNTER — Encounter

## 2023-05-21 MED ORDER — SYNTHROID 50 MCG PO TABS
50 | ORAL_TABLET | Freq: Every day | ORAL | 3 refills | Status: DC
Start: 2023-05-21 — End: 2024-05-16

## 2023-05-21 NOTE — Telephone Encounter (Signed)
Pt last seen on 05/10/23. Due to return in one year.    Has appt on 05/09/24.    Rx last filled on 02/18/23 #90/0RF.    Rx sent to pharmacy as #90/3RF and verified by Verbal Order Read Back with provider.

## 2024-01-31 NOTE — Progress Notes (Signed)
 Patient's HM shows they are overdue for Colorectal Screening.   Colonoscopy received. HM updated.

## 2024-05-09 ENCOUNTER — Ambulatory Visit
Admit: 2024-05-09 | Discharge: 2024-05-09 | Payer: Medicare (Managed Care) | Attending: Student in an Organized Health Care Education/Training Program | Primary: Student in an Organized Health Care Education/Training Program

## 2024-05-09 ENCOUNTER — Encounter

## 2024-05-09 ENCOUNTER — Inpatient Hospital Stay
Admit: 2024-05-09 | Discharge: 2024-05-09 | Payer: Medicare (Managed Care) | Primary: Student in an Organized Health Care Education/Training Program

## 2024-05-09 VITALS — BP 134/88 | HR 88 | Temp 97.50000°F | Resp 20 | Ht 66.0 in | Wt 212.8 lb

## 2024-05-09 DIAGNOSIS — Z0000 Encounter for general adult medical examination without abnormal findings: Principal | ICD-10-CM

## 2024-05-09 NOTE — Progress Notes (Signed)
 "Medicare Annual Wellness Visit    Dawn Quinn is here for Medicare AWV    Assessment & Plan   Medicare annual wellness visit, subsequent  Acquired hypothyroidism  Assessment & Plan:  Will update TSH to assess control. She is taking synthroid  brand name.   Orders:  -     TSH; Future  Osteopenia of multiple sites  Assessment & Plan:  She is taking Vit D. Encouraged weight bearing exercise. Repeat DEXA and check Vit D  Menopause  -     DEXA BONE DENSITY AXIAL SKELETON; Future  Vitamin D deficiency  -     Vitamin D 25 Hydroxy; Future  Psoriatic arthritis [facility])  Assessment & Plan:  Followed by rheum Dr Saralyn. She takes humira and PRN mobic   Flu vaccine need  -     Influenza, FLUAD Trivalent, (age 5 y+), IM, Preservative Free, 0.5mL    L ear exam normal. Try flonase daily or allegra daily for seasonal allergies.      Return in 1 year (on 05/09/2025) for Medicare AWV.     Subjective   The following acute and/or chronic problems were also addressed today:    Feeling well this year.   A couple of years ago took a long flight with a bad cold and thinks it affected her L ear. Now having bubbles in the ear     Hypothyroidism   - synthroid  brand name is required     Osteopenia   - vit D 2000 IU daily  - exercise - primarily stationary bike - has lost 14lb recently     Psoriatic arthritis   - Rheum Dr Saralyn  - Humira  - gets labs done every 6 mo     Derm Dr Helane Jacobus   - had Mohs surgery on lip but turned out benign.      GYN Dr Francia  - topical steroid cream to use PRN for vaginal itching  - ovarian cyst stable on imaging - was told not to worry about this     Asymtpomatic microscopic hematuira - Uro Dr Linden at [facility] in 2023 - work-up negative     Dentist Dr Fairy Plan  Optho Dr Gwenn Heys    Patient's complete Health Risk Assessment and screening values have been reviewed and are found in Flowsheets. The following problems were reviewed today and where indicated follow up appointments were made and/or  referrals ordered.    Positive Risk Factor Screenings with Interventions:              Inactivity:  On average, how many days per week do you engage in moderate to strenuous exercise (like a brisk walk)?: 0 days (!) Abnormal     Interventions:  Patient comments: see HPI     Abnormal BMI (obese):  Body mass index is 34.35 kg/m. (!) Abnormal  Interventions:  Patient comments: see HPI                    Flu vaccine: will get today   COVID vaccine: 04/2023 - will get at pharmacy   RSV: 04/21/2022  Tetanus vaccine: due, will get booster from pharmacy   Shingles vaccine: reports she received in [place] and pharmacy shut down   Pneumonia vaccine: PCV13 07/2015 - needs PCV20  Breast cancer screening: 04/2023 with Dr Francia - reminded to schedule   Colon cancer screening: 12/2022 Q9yr Dr Neysa.  DEXA: 11/2021 osteopenia low FRAX - will repeat   Hep  C: 10/2021   Lipid: 10/2021 Q71yr  DM:  Healthcare decision maker: daughter  ACP: on file          Objective   Vitals:    05/09/24 1006   BP: 134/88   Pulse: 88   Resp: 20   Temp: 97.5 F (36.4 C)   TempSrc: Temporal   SpO2: 97%   Weight: 96.5 kg (212 lb 12.8 oz)   Height: 1.676 m (5' 6)      Body mass index is 34.35 kg/m.      Physical Exam  HENT:      Left Ear: Tympanic membrane and ear canal normal. There is no impacted cerumen (there was a flake of cerumen obstructing view of TM which was pushed to the side with a curette).   Cardiovascular:      Rate and Rhythm: Normal rate and regular rhythm.      Heart sounds: No murmur heard.  Pulmonary:      Effort: No respiratory distress.      Breath sounds: Normal breath sounds.   Abdominal:      General: Bowel sounds are normal.      Palpations: Abdomen is soft.   Musculoskeletal:      Right lower leg: No edema.      Left lower leg: No edema.   Neurological:      Mental Status: She is alert. Mental status is at baseline.   Psychiatric:         Mood and Affect: Mood normal.         Behavior: Behavior normal.                 Allergies    Allergen Reactions    Sulfa Antibiotics      Prior to Visit Medications   Medication Sig Taking? Authorizing Provider   SYNTHROID  50 MCG tablet TAKE 1 TABLET BY MOUTH DAILY Yes Laurenashley Viar R, MD   UNABLE TO FIND Osteobioflex- once per day Yes [provider]   VITAMIN D PO Take 50 mcg by mouth daily Yes [provider]   meloxicam (MOBIC) 15 MG tablet Take 1 tablet by mouth as needed for Pain Yes [provider]   adalimumab (HUMIRA) 40 MG/0.8ML injection Inject 0.8 mLs into the skin every 14 days Yes [provider]       CareTeam (Including outside providers/suppliers regularly involved in providing care):   Patient Care Team:  Silva Hoy SAUNDERS, MD as PCP - General  Satara Virella, Hoy SAUNDERS, MD as PCP - Empaneled Provider     Recommendations for Preventive Services Due: see orders and patient instructions/AVS.  Recommended screening schedule for the next 5-10 years is provided to the patient in written form: see Patient Instructions/AVS.     Reviewed and updated this visit:  Tobacco  Allergies  Meds  Problems  Med Hx  Surg Hx  Fam Hx               "

## 2024-05-09 NOTE — Patient Instructions (Addendum)
"  Please obtain the following vaccines from your local pharmacy. Please ask your pharmacy to fax our office a copy of the vaccination record. Our fax number is [phone].   - COVID booster  - tetanus booster     Mammogram due this month     Personalized Preventive Plan for Mossie Georgia - 05/09/2024  Medicare offers a range of preventive health benefits. Some of the tests and screenings are paid in full while other may be subject to a deductible, co-insurance, and/or copay.  Some of these benefits include a comprehensive review of your medical history including lifestyle, illnesses that may run in your family, and various assessments and screenings as appropriate.  After reviewing your medical record and screening and assessments performed today your provider may have ordered immunizations, labs, imaging, and/or referrals for you.  A list of these orders (if applicable) as well as your Preventive Care list are included within your After Visit Summary for your review.      "

## 2024-05-09 NOTE — Assessment & Plan Note (Signed)
 Followed by rheum Dr Saralyn. She takes humira and PRN mobic

## 2024-05-09 NOTE — Assessment & Plan Note (Signed)
"  She is taking Vit D. Encouraged weight bearing exercise. Repeat DEXA and check Vit D  "

## 2024-05-09 NOTE — Assessment & Plan Note (Addendum)
"  Will update TSH to assess control. She is taking synthroid  brand name.   "

## 2024-05-10 LAB — T4, FREE: T4 Free: 1 ng/dL (ref 0.9–1.6)

## 2024-05-10 LAB — VITAMIN D 25 HYDROXY: Vit D, 25-Hydroxy: 49.8 ng/mL (ref 30.0–100.0)

## 2024-05-10 LAB — TSH: TSH, 3rd Generation: 4.28 u[IU]/mL — ABNORMAL HIGH (ref 0.270–4.200)

## 2024-05-16 ENCOUNTER — Encounter

## 2024-05-16 MED ORDER — SYNTHROID 50 MCG PO TABS
50 | ORAL_TABLET | Freq: Every day | ORAL | 3 refills | 90.00000 days | Status: AC
Start: 2024-05-16 — End: ?

## 2024-05-16 NOTE — Result Encounter Note (Signed)
"  Will sent [facility]  - TFT stable, continue synthroid   - vit D is good - continue current dose of supplement  "

## 2024-05-23 ENCOUNTER — Encounter

## 2024-05-23 NOTE — Telephone Encounter (Signed)
"  Pt last seen on 05/09/2024. Due to return in one year.    Has appt on 05/09/25.    Rx last filled on 05/16/24 #90/3RF.      "

## 2024-05-25 ENCOUNTER — Inpatient Hospital Stay
Admit: 2024-05-25 | Payer: Medicare (Managed Care) | Attending: Student in an Organized Health Care Education/Training Program | Primary: Student in an Organized Health Care Education/Training Program

## 2024-05-25 DIAGNOSIS — Z780 Asymptomatic menopausal state: Principal | ICD-10-CM

## 2024-05-25 NOTE — Pre-Procedure Instructions (Signed)
"  05/22/2024 2:12 p.m.  Left message for patient to return call in order to confirm arrival time and give instructions for appointment scheduled on 05/24/2024.  "
